# Patient Record
Sex: Male | Born: 1940 | Race: White | Hispanic: No | Marital: Married | Smoking: Former smoker
Health system: Southern US, Community
[De-identification: ages and names within clinical notes are randomized; demographics above are authoritative.]

## PROBLEM LIST (undated history)

## (undated) DIAGNOSIS — I10 Essential (primary) hypertension: Secondary | ICD-10-CM

## (undated) DIAGNOSIS — N189 Chronic kidney disease, unspecified: Secondary | ICD-10-CM

## (undated) DIAGNOSIS — I219 Acute myocardial infarction, unspecified: Secondary | ICD-10-CM

## (undated) DIAGNOSIS — G4730 Sleep apnea, unspecified: Secondary | ICD-10-CM

## (undated) DIAGNOSIS — J449 Chronic obstructive pulmonary disease, unspecified: Secondary | ICD-10-CM

## (undated) DIAGNOSIS — E785 Hyperlipidemia, unspecified: Secondary | ICD-10-CM

## (undated) HISTORY — PX: CORONARY ARTERY BYPASS GRAFT: SHX141

## (undated) HISTORY — DX: Sleep apnea, unspecified: G47.30

## (undated) HISTORY — PX: TONSILLECTOMY: SUR1361

## (undated) HISTORY — DX: Chronic kidney disease, unspecified: N18.9

## (undated) HISTORY — DX: Hyperlipidemia, unspecified: E78.5

## (undated) HISTORY — DX: Chronic obstructive pulmonary disease, unspecified: J44.9

## (undated) HISTORY — PX: FOOT SURGERY: SHX648

## (undated) HISTORY — PX: KIDNEY SURGERY: SHX687

## (undated) HISTORY — PX: OTHER SURGICAL HISTORY: SHX169

## (undated) HISTORY — PX: REPLACEMENT TOTAL KNEE: SUR1224

## (undated) HISTORY — DX: Essential (primary) hypertension: I10

## (undated) HISTORY — DX: Acute myocardial infarction, unspecified: I21.9

---

## 2003-12-16 ENCOUNTER — Ambulatory Visit: Payer: Self-pay

## 2004-02-20 ENCOUNTER — Ambulatory Visit: Payer: Self-pay | Admitting: General Practice

## 2006-10-28 ENCOUNTER — Ambulatory Visit: Payer: Self-pay | Admitting: Internal Medicine

## 2007-08-29 ENCOUNTER — Ambulatory Visit: Payer: Self-pay | Admitting: Internal Medicine

## 2007-08-29 ENCOUNTER — Other Ambulatory Visit: Payer: Self-pay

## 2007-09-23 ENCOUNTER — Encounter: Payer: Self-pay | Admitting: Internal Medicine

## 2007-10-22 ENCOUNTER — Encounter: Payer: Self-pay | Admitting: Internal Medicine

## 2007-11-22 ENCOUNTER — Encounter: Payer: Self-pay | Admitting: Internal Medicine

## 2007-12-22 ENCOUNTER — Encounter: Payer: Self-pay | Admitting: Internal Medicine

## 2010-10-15 ENCOUNTER — Ambulatory Visit: Payer: Self-pay | Admitting: Internal Medicine

## 2015-08-07 ENCOUNTER — Encounter: Payer: Medicare HMO | Attending: Internal Medicine | Admitting: *Deleted

## 2015-08-07 VITALS — Ht 74.5 in | Wt 308.0 lb

## 2015-08-07 DIAGNOSIS — I208 Other forms of angina pectoris: Secondary | ICD-10-CM

## 2015-08-07 NOTE — Progress Notes (Signed)
Cardiac Individual Treatment Plan  Patient Details  Name: Bradley ChamberlainWilliam C Goodwill Jr. MRN: [id number] Date of Birth: 20-Nov-1940 Referring Provider:        Card[facility]   Referring Provider  Bradley Barnes      Initial Encounter Date:       [facility]   Date  08/07/15   Referring Provider  Bradley Barnes      Visit Diagnosis: Stable angina [facility](HCC)  Patient's Home Medications on Admission:  Current outpatient prescriptions:  .  Acetaminophen 500 MG coapsule, Take by mouth., Disp: , Rfl:  .  albuterol (PROAIR HFA) 108 (90 Base) MCG/ACT inhaler, Inhale into the lungs., Disp: , Rfl:  .  Alum Hydroxide-Mag Carbonate (GAVISCON EXTRA RELIEF FORMULA PO), Take by mouth., Disp: , Rfl:  .  amLODipine (NORVASC) 10 MG tablet, Take by mouth., Disp: , Rfl:  .  aspirin EC 81 MG tablet, Take by mouth., Disp: , Rfl:  .  atorvastatin (LIPITOR) 40 MG tablet, Take by mouth., Disp: , Rfl:  .  docusate (COLACE) 50 MG/5ML liquid, Take by mouth., Disp: , Rfl:  .  Investigational - Study Medication, Take by mouth. [facility] IDS, Disp: , Rfl:  .  isosorbide mononitrate (IMDUR) 60 MG 24 hr tablet, Take by mouth., Disp: , Rfl:  .  metoprolol (TOPROL-XL) 200 MG 24 hr tablet, Take by mouth., Disp: , Rfl:  .  Multiple Vitamin (MULTI-VITAMINS) TABS, Take by mouth., Disp: , Rfl:  .  nitroGLYCERIN (NITROSTAT) 0.4 MG SL tablet, Place under the tongue., Disp: , Rfl:  .  Omega-3 1000 MG CAPS, Take by mouth., Disp: , Rfl:  .  ramipril (ALTACE) 10 MG capsule, Take by mouth., Disp: , Rfl:  .  ranitidine (ZANTAC) 300 MG tablet, Take by mouth., Disp: , Rfl:  .  sodium chloride (OCEAN) 0.65 % nasal spray, Place 1 spray into the nose as needed for congestion., Disp: , Rfl:  .  sodium chloride (V-R NASAL SPRAY SALINE) 0.65 % nasal spray, , Disp: , Rfl:  .  tiotropium (SPIRIVA) 18 MCG inhalation capsule, Place into inhaler and inhale.,  Disp: , Rfl:  .  White Petrolatum-Mineral Oil (TEARS NATURALE PM OP), Apply to eye. Dextran 70 eye drops used, Disp: , Rfl:   Past Medical History: No past medical history on file.  Tobacco Use: History  Smoking status  . Not on file  Smokeless tobacco  . Not on file    Labs: Recent Review Flowsheet Data    There is no flowsheet data to display.       Exercise Target Goals: Date: 08/07/15  Exercise Program Goal: Individual exercise prescription set with THRR, safety & activity barriers. Participant demonstrates ability to understand and report RPE using BORG scale, to self-measure pulse accurately, and to acknowledge the importance of the exercise prescription.  Exercise Prescription Goal: Starting with aerobic activity 30 plus minutes a day, 3 days per week for initial exercise prescription. Provide home exercise prescription and guidelines that participant acknowledges understanding prior to discharge.  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 Back pain from scars of procedures   Cardiac Risk Stratification High      6 Minute Walk:     6 Minute Walk      08/07/15 1439       6 Minute Walk   Distance 858 feet     Walk Time 6 minutes     MPH 1.63     METS 1.3     RPE 15     VO2 Peak 4.53     Symptoms Yes (comment)     Comments short of breath and right R knee and hip hurt      Resting HR 56 bpm     Resting BP 124/60 mmHg     Max Ex. HR 107 bpm     Max Ex. BP 142/68 mmHg         Initial Exercise Prescription:     Initial Exercise Prescription - 08/07/15 1400    Date of Initial Exercise RX and Referring Provider   Date 08/07/15   Referring Provider Bradley Barnes   Treadmill   MPH 1   Grade 0   Minutes 5  build to 10   METs 1.77   NuStep   Level 2   Minutes 15   METs 1.6   Arm Ergometer   Level 1   Minutes 15   METs 1.6   Recumbant Elliptical   Level 1   RPM 60   Minutes 15   METs 2   T5 Nustep   Level 2   Minutes 15   METs 2   Biostep-RELP   Level 2   Minutes 15   METs 2   Prescription Details   Frequency (times per week) 3   Duration Progress to 30 minutes of continuous aerobic without signs/symptoms of physical distress   Intensity   THRR 40-80% of Max Heartrate 92-127   Ratings of Perceived Exertion 11-13   Progression   Progression Continue to progress workloads to maintain intensity without signs/symptoms of physical distress.   Resistance Training   Training Prescription Yes   Weight 3   Reps 10-15      Perform Capillary Blood Glucose checks as needed.  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          Discharge Exercise Prescription (Final Exercise Prescription Changes):     Exercise Prescription Changes - 08/07/15 1230    Response to Exercise   Blood Pressure (Admit) 124/60 mmHg   Blood Pressure (Exercise) 142/68 mmHg   Blood Pressure (Exit) 122/70 mmHg   Heart Rate (Admit) 56 bpm   Heart  Rate (Exercise) 107 bpm   Heart Rate (Exit) 74 bpm      Nutrition:  Target Goals: Understanding of nutrition guidelines, daily intake of sodium 1500mg , cholesterol 200mg , calories 30% from fat and 7% or less from saturated fats, daily to have 5 or more servings of fruits and vegetables.  Biometrics:     Pre Biometrics - 08/07/15 1438    Pre Biometrics   Height 6' 2.5" (1.892 m)   Weight (!) 308 lb (139.708 kg)   Waist Circumference 55 inches   Hip Circumference 51.25 inches   Waist to Hip Ratio 1.07 %   BMI (Calculated) 39.1       Nutrition Therapy Plan and Nutrition Goals:     Nutrition Therapy & Goals - 08/07/15 1548    Intervention Plan   Intervention Prescribe, educate and counsel regarding individualized specific dietary modifications aiming towards targeted core components such as weight, hypertension, lipid management, diabetes, heart failure and other comorbidities.   Expected Outcomes Short Term Goal: Understand basic principles of dietary content, such as calories, fat, sodium, cholesterol and nutrients.;Short Term Goal: A plan has been developed with personal nutrition goals set during dietitian appointment.;Long Term Goal: Adherence to prescribed nutrition plan.      Nutrition Discharge: Rate Your Plate Scores:   Nutrition Goals Re-Evaluation:   Psychosocial: Target Goals: Acknowledge presence or absence of depression, maximize coping skills, provide positive support system. Participant is able to verbalize types and ability to use techniques and skills needed for reducing stress and depression.  Initial Review & Psychosocial Screening:     Initial Psych Review & Screening - 08/07/15 1515    Family Dynamics   Good Support System? Yes  wife and church.  has one churchhe continues to go for prayer group and another he attends   Barriers   Psychosocial barriers to participate in program There are no identifiable barriers or psychosocial needs.;The patient  should benefit from training in stress management and relaxation.   Screening Interventions   Interventions Encouraged to exercise      Quality of Life Scores:     Quality of Life - 08/07/15 1513    Quality of Life Scores   Health/Function Pre 17.5 %   Socioeconomic Pre 24.25 %   Psych/Spiritual Pre 23.5 %   Family Pre 20.3 %   GLOBAL Pre 20.42 %      PHQ-9:     Recent Review Flowsheet Data    Depression screen [facility] 2/9 08/07/2015   Decreased Interest 1   Down, Depressed, Hopeless 0   PHQ - 2 Score 1   Altered sleeping 1   Tired, decreased energy 1   Change in appetite 0   Feeling bad or failure about yourself  0   Trouble concentrating 1   Moving slowly or fidgety/restless 0   Suicidal thoughts 0   PHQ-9 Score 4   Difficult doing work/chores Somewhat difficult      Psychosocial Evaluation and Intervention:   Psychosocial Re-Evaluation:   Vocational Rehabilitation: Provide vocational rehab assistance to qualifying candidates.   Vocational Rehab Evaluation & Intervention:     Vocational Rehab - 08/07/15 1511    Initial Vocational Rehab Evaluation & Intervention   Assessment shows need for Vocational Rehabilitation No      Education: Education Goals: Education classes will be provided on a weekly basis, covering required topics. Participant will state understanding/return demonstration of topics presented.  Learning Barriers/Preferences:     Learning Barriers/Preferences - 08/07/15 1555  Learning Barriers/Preferences   Learning Barriers Exercise Concerns  SInce foot surgery and immobility causing deconditioning      Education Topics: General Nutrition Guidelines/Fats and Fiber: -Group instruction provided by verbal, written material, models and posters to present the general guidelines for heart healthy nutrition. Gives an explanation and review of dietary fats and fiber.   Controlling Sodium/Reading Food Labels: -Group verbal and written  material supporting the discussion of sodium use in heart healthy nutrition. Review and explanation with models, verbal and written materials for utilization of the food label.   Exercise Physiology & Risk Factors: - Group verbal and written instruction with models to review the exercise physiology of the cardiovascular system and associated critical values. Details cardiovascular disease risk factors and the goals associated with each risk factor.   Aerobic Exercise & Resistance Training: - Gives group verbal and written discussion on the health impact of inactivity. On the components of aerobic and resistive training programs and the benefits of this training and how to safely progress through these programs.   Flexibility, Balance, General Exercise Guidelines: - Provides group verbal and written instruction on the benefits of flexibility and balance training programs. Provides general exercise guidelines with specific guidelines to those with heart or lung disease. Demonstration and skill practice provided.   Stress Management: - Provides group verbal and written instruction about the health risks of elevated stress, cause of high stress, and healthy ways to reduce stress.   Depression: - Provides group verbal and written instruction on the correlation between heart/lung disease and depressed mood, treatment options, and the stigmas associated with seeking treatment.   Anatomy & Physiology of the Heart: - Group verbal and written instruction and models provide basic cardiac anatomy and physiology, with the coronary electrical and arterial systems. Review of: AMI, Angina, Valve disease, Heart Failure, Cardiac Arrhythmia, Pacemakers, and the ICD.   Cardiac Procedures: - Group verbal and written instruction and models to describe the testing methods done to diagnose heart disease. Reviews the outcomes of the test results. Describes the treatment choices: Medical Management, Angioplasty,  or Coronary Bypass Surgery.   Cardiac Medications: - Group verbal and written instruction to review commonly prescribed medications for heart disease. Reviews the medication, class of the drug, and side effects. Includes the steps to properly store meds and maintain the prescription regimen.   Go Sex-Intimacy & Heart Disease, Get SMART - Goal Setting: - Group verbal and written instruction through game format to discuss heart disease and the return to sexual intimacy. Provides group verbal and written material to discuss and apply goal setting through the application of the S.M.A.R.T. Method.   Other Matters of the Heart: - Provides group verbal, written materials and models to describe Heart Failure, Angina, Valve Disease, and Diabetes in the realm of heart disease. Includes description of the disease process and treatment options available to the cardiac patient.   Exercise & Equipment Safety: - Individual verbal instruction and demonstration of equipment use and safety with use of the equipment.   Infection Prevention: - Provides verbal and written material to individual with discussion of infection control including proper hand washing and proper equipment cleaning during exercise session.   Falls Prevention: - Provides verbal and written material to individual with discussion of falls prevention and safety.   Diabetes: - Individual verbal and written instruction to review signs/symptoms of diabetes, desired ranges of glucose level fasting, after meals and with exercise. Advice that pre and post exercise glucose checks will be done for  3 sessions at entry of program.    Knowledge Questionnaire Score:     Knowledge Questionnaire Score - 08/07/15 1509    Knowledge Questionnaire Score   Pre Score 27/28      Core Components/Risk Factors/Patient Goals at Admission:     Personal Goals and Risk Factors at Admission - 08/07/15 1516    Core Components/Risk Factors/Patient  Goals on Admission    Weight Management Yes;Obesity   Intervention Weight Management: Develop a combined nutrition and exercise program designed to reach desired caloric intake, while maintaining appropriate intake of nutrient and fiber, sodium and fats, and appropriate energy expenditure required for the weight goal.;Weight Management: Provide education and appropriate resources to help participant work on and attain dietary goals.;Weight Management/Obesity: Establish reasonable short term and long term weight goals.;Obesity: Provide education and appropriate resources to help participant work on and attain dietary goals.   Admit Weight 308 lb 4.8 oz (139.844 kg)   Goal Weight: Short Term 300 lb (136.079 kg)   Goal Weight: Long Term 220 lb (99.791 kg)   Expected Outcomes Short Term: Continue to assess and modify interventions until short term weight is achieved;Long Term: Adherence to nutrition and physical activity/exercise program aimed toward attainment of established weight goal   Sedentary Yes  has had foot surgery x 2 since sept 2015, been sedentary since   Intervention Provide advice, education, support and counseling about physical activity/exercise needs.;Develop an individualized exercise prescription for aerobic and resistive training based on initial evaluation findings, risk stratification, comorbidities and participant's personal goals.   Expected Outcomes Achievement of increased cardiorespiratory fitness and enhanced flexibility, muscular endurance and strength shown through measurements of functional capacity and personal statement of participant.   Increase Strength and Stamina Yes   Intervention Provide advice, education, support and counseling about physical activity/exercise needs.;Develop an individualized exercise prescription for aerobic and resistive training based on initial evaluation findings, risk stratification, comorbidities and participant's personal goals.   Expected  Outcomes Achievement of increased cardiorespiratory fitness and enhanced flexibility, muscular endurance and strength shown through measurements of functional capacity and personal statement of participant.   Hypertension Yes   Intervention Provide education on lifestyle modifcations including regular physical activity/exercise, weight management, moderate sodium restriction and increased consumption of fresh fruit, vegetables, and low fat dairy, alcohol moderation, and smoking cessation.;Monitor prescription use compliance.   Expected Outcomes Short Term: Continued assessment and intervention until BP is < 140/32mm HG in hypertensive participants. < 130/64mm HG in hypertensive participants with diabetes, heart failure or chronic kidney disease.;Long Term: Maintenance of blood pressure at goal levels.   Lipids Yes   Intervention Provide education and support for participant on nutrition & aerobic/resistive exercise along with prescribed medications to achieve LDL 70mg , HDL >40mg .   Expected Outcomes Short Term: Participant states understanding of desired cholesterol values and is compliant with medications prescribed. Participant is following exercise prescription and nutrition guidelines.;Long Term: Cholesterol controlled with medications as prescribed, with individualized exercise RX and with personalized nutrition plan. Value goals: LDL < , HDL > 40 mg.      Core Components/Risk Factors/Patient Goals Review:    Core Components/Risk Factors/Patient Goals at Discharge (Final Review):    ITP Comments:     ITP Comments      08/07/15 1503           ITP Comments medical review and intial ITP completed.  Doscumnetation of diagnosis is found CARE EVERYWHERE [facility] 07/28/2015 office visit  Comments:

## 2015-08-07 NOTE — Patient Instructions (Signed)
Patient Instructions  Patient Details  Name: Bradley ChamberlainWilliam C Gaultney Jr. MRN: [id number] Date of Birth: [birth year] Referring Provider:  Irena ReichmannGranger, Chris, MD  Below are the personal goals you chose as well as exercise and nutrition goals. Our goal is to help you keep on track towards obtaining and maintaining your goals. We will be discussing your progress on these goals with you throughout the program.  Initial Exercise Prescription:     Initial Exercise Prescription - 08/07/15 1400    Date of Initial Exercise RX and Referring Provider   Date 08/07/15   Referring Provider [name]   Treadmill   MPH 1   Grade 0   Minutes 5  build to 10   METs 1.77   NuStep   Level 2   Minutes 15   METs 1.6   Arm Ergometer   Level 1   Minutes 15   METs 1.6   Recumbant Elliptical   Level 1   RPM 60   Minutes 15   METs 2   T5 Nustep   Level 2   Minutes 15   METs 2   Biostep-RELP   Level 2   Minutes 15   METs 2   Prescription Details   Frequency (times per week) 3   Duration Progress to 30 minutes of continuous aerobic without signs/symptoms of physical distress   Intensity   THRR 40-80% of Max Heartrate 92-127   Ratings of Perceived Exertion 11-13   Progression   Progression Continue to progress workloads to maintain intensity without signs/symptoms of physical distress.   Resistance Training   Training Prescription Yes   Weight 3   Reps 10-15      Exercise Goals: Frequency: Be able to perform aerobic exercise three times per week working toward 3-5 days per week.  Intensity: Work with a perceived exertion of 11 (fairly light) - 15 (hard) as tolerated. Follow your new exercise prescription and watch for changes in prescription as you progress with the program. Changes will be reviewed with you when they are made.  Duration: You should be able to do 30 minutes of continuous aerobic exercise in addition to a 5 minute warm-up and a 5 minute cool-down routine.  Nutrition Goals: Your  personal nutrition goals will be established when you do your nutrition analysis with the dietician.  The following are nutrition guidelines to follow: Cholesterol < 200mg /day Sodium < 1500mg /day Fiber: Men over 50 yrs - 30 grams per day  Personal Goals:     Personal Goals and Risk Factors at Admission - 08/07/15 1516    Core Components/Risk Factors/Patient Goals on Admission    Weight Management Yes;Obesity   Intervention Weight Management: Develop a combined nutrition and exercise program designed to reach desired caloric intake, while maintaining appropriate intake of nutrient and fiber, sodium and fats, and appropriate energy expenditure required for the weight goal.;Weight Management: Provide education and appropriate resources to help participant work on and attain dietary goals.;Weight Management/Obesity: Establish reasonable short term and long term weight goals.;Obesity: Provide education and appropriate resources to help participant work on and attain dietary goals.   Admit Weight 308 lb 4.8 oz (139.844 kg)   Goal Weight: Short Term 300 lb (136.079 kg)   Goal Weight: Long Term 220 lb (99.791 kg)   Expected Outcomes Short Term: Continue to assess and modify interventions until short term weight is achieved;Long Term: Adherence to nutrition and physical activity/exercise program aimed toward attainment of established weight goal   Sedentary Yes  has had foot surgery x 2 since sept 2015, been sedentary since   Intervention Provide advice, education, support and counseling about physical activity/exercise needs.;Develop an individualized exercise prescription for aerobic and resistive training based on initial evaluation findings, risk stratification, comorbidities and participant's personal goals.   Expected Outcomes Achievement of increased cardiorespiratory fitness and enhanced flexibility, muscular endurance and strength shown through measurements of functional capacity and personal  statement of participant.   Increase Strength and Stamina Yes   Intervention Provide advice, education, support and counseling about physical activity/exercise needs.;Develop an individualized exercise prescription for aerobic and resistive training based on initial evaluation findings, risk stratification, comorbidities and participant's personal goals.   Expected Outcomes Achievement of increased cardiorespiratory fitness and enhanced flexibility, muscular endurance and strength shown through measurements of functional capacity and personal statement of participant.   Hypertension Yes   Intervention Provide education on lifestyle modifcations including regular physical activity/exercise, weight management, moderate sodium restriction and increased consumption of fresh fruit, vegetables, and low fat dairy, alcohol moderation, and smoking cessation.;Monitor prescription use compliance.   Expected Outcomes Short Term: Continued assessment and intervention until BP is < 140/66mm HG in hypertensive participants. < 130/78mm HG in hypertensive participants with diabetes, heart failure or chronic kidney disease.;Long Term: Maintenance of blood pressure at goal levels.   Lipids Yes   Intervention Provide education and support for participant on nutrition & aerobic/resistive exercise along with prescribed medications to achieve LDL 70mg , HDL >40mg .   Expected Outcomes Short Term: Participant states understanding of desired cholesterol values and is compliant with medications prescribed. Participant is following exercise prescription and nutrition guidelines.;Long Term: Cholesterol controlled with medications as prescribed, with individualized exercise RX and with personalized nutrition plan. Value goals: LDL < , HDL > 40 mg.      Tobacco Use Initial Evaluation: History  Smoking status  . Not on file  Smokeless tobacco  . Not on file    Copy of goals given to participant.

## 2015-08-15 ENCOUNTER — Encounter: Payer: Medicare HMO | Admitting: *Deleted

## 2015-08-15 DIAGNOSIS — I208 Other forms of angina pectoris: Secondary | ICD-10-CM | POA: Diagnosis not present

## 2015-08-15 NOTE — Progress Notes (Signed)
Daily Session Note  Patient Details  Name: Bradley Barnes. MRN: [id number] Date of Birth: 1940-11-08 Referring Provider:   Flowsheet Row Cardiac Rehab from 08/07/2015 in [facility] Cardiac and Pulmonary Rehab  Referring Provider  [name]      Encounter Date: 08/15/2015  Check In:     Session Check In - 08/15/15 0847      Check-In   Location [facility]-Cardiac & Pulmonary Rehab   Staff Present Alberteen Sam, MA, ACSM RCEP, Exercise Physiologist;Carroll Enterkin, RN, BSN   Supervising physician immediately available to respond to emergencies See telemetry face sheet for immediately available ER MD   Medication changes reported     No   Fall or balance concerns reported    No   Warm-up and Cool-down Performed on first and last piece of equipment   Resistance Training Performed Yes   VAD Patient? No     Pain Assessment   Currently in Pain? No/denies   Multiple Pain Sites No         Goals Met:  Exercise tolerated well Personal goals reviewed No report of cardiac concerns or symptoms Strength training completed today  Goals Unmet:  Not Applicable  Comments: First full day of exercise!  Patient was oriented to gym and equipment including functions, settings, policies, and procedures.  Patient's individual exercise prescription and treatment plan were reviewed.  All starting workloads were established based on the results of the 6 minute walk test done at initial orientation visit.  The plan for exercise progression was also introduced and progression will be customized based on patient's performance and goals.    Dr. Emily Filbert is Medical Director for [place] and [facility].

## 2015-08-15 NOTE — Progress Notes (Signed)
Daily Session Note  Patient Details  Name: Bradley Barnes. MRN: [id number] Date of Birth: [birth year] Referring Provider:   Flowsheet Row Cardiac Rehab from 08/07/2015 in [facility] Cardiac and Pulmonary Rehab  Referring Provider  [name]      Encounter Date: 08/15/2015  Check In:     Session Check In - 08/15/15 1014      Check-In   Staff Present Heath Lark, RN, BSN, CCRP         Goals Met:  Exercise tolerated well No report of cardiac concerns or symptoms Strength training completed today  Goals Unmet:  Not Applicable  Comments: Doing well with exercise prescription progression.    Dr. Emily Filbert is Medical Director for [place] and [facility].

## 2015-08-17 ENCOUNTER — Encounter: Payer: Medicare HMO | Admitting: *Deleted

## 2015-08-17 DIAGNOSIS — I208 Other forms of angina pectoris: Secondary | ICD-10-CM

## 2015-08-17 NOTE — Progress Notes (Signed)
Daily Session Note  Patient Details  Name: Bradley Barnes. MRN: [id number] Date of Birth: [birth year] Referring Provider:   Flowsheet Row Cardiac Rehab from 08/07/2015 in [facility] Cardiac and Pulmonary Rehab  Referring Provider  [name]      Encounter Date: 08/17/2015  Check In:     Session Check In - 08/17/15 0945      Check-In   Location [facility]-Cardiac & Pulmonary Rehab   Staff Present Alberteen Sam, MA, ACSM RCEP, Exercise Physiologist;Amanda Oletta Darter, BA, ACSM CEP, Exercise Physiologist;Carroll Enterkin, RN, BSN   Supervising physician immediately available to respond to emergencies See telemetry face sheet for immediately available ER MD   Medication changes reported     No   Fall or balance concerns reported    No   Warm-up and Cool-down Performed on first and last piece of equipment   Resistance Training Performed Yes   VAD Patient? No     Pain Assessment   Currently in Pain? No/denies   Multiple Pain Sites No           Exercise Prescription Changes - 08/16/15 1500      Exercise Review   Progression No  First full day of exercise     Response to Exercise   Blood Pressure (Admit) 128/82   Blood Pressure (Exercise) 148/82   Blood Pressure (Exit) 124/78   Heart Rate (Admit) 77 bpm   Heart Rate (Exercise) 108 bpm   Heart Rate (Exit) 74 bpm   Rating of Perceived Exertion (Exercise) 12   Symptoms none   Duration Progress to 45 minutes of aerobic exercise without signs/symptoms of physical distress   Intensity THRR unchanged     Progression   Progression Continue to progress workloads to maintain intensity without signs/symptoms of physical distress.   Average METs 2.19     Resistance Training   Training Prescription Yes   Weight 3   Reps 10-15     Interval Training   Interval Training No     Treadmill   MPH --  unable to do treadmill due to knees and back     Bike   Level 0.8   Minutes 15   METs 2.08     NuStep   Level 2   Minutes 15   METs 2.3      Goals Met:  Independence with exercise equipment Exercise tolerated well No report of cardiac concerns or symptoms Strength training completed today  Goals Unmet:  Not Applicable  Comments: Pt able to follow exercise prescription today without complaint.  Will continue to monitor for progression.    Dr. Emily Filbert is Medical Director for [place] and [facility].

## 2015-08-22 ENCOUNTER — Encounter: Payer: Medicare HMO | Attending: Internal Medicine | Admitting: *Deleted

## 2015-08-22 DIAGNOSIS — I208 Other forms of angina pectoris: Secondary | ICD-10-CM | POA: Diagnosis not present

## 2015-08-22 NOTE — Progress Notes (Signed)
Daily Session Note  Patient Details  Name: Bradley Barnes. MRN: [id number] Date of Birth: [birth year] Referring Provider:   Flowsheet Row Cardiac Rehab from 08/07/2015 in [facility] Cardiac and Pulmonary Rehab  Referring Provider  [name]      Encounter Date: 08/22/2015  Check In:     Session Check In - 08/22/15 0836      Check-In   Location [facility]-Cardiac & Pulmonary Rehab   Staff Present Heath Lark, RN, BSN, CCRP;Rebecca Brayton El, DPT, CEEA   Supervising physician immediately available to respond to emergencies See telemetry face sheet for immediately available ER MD   Medication changes reported     No   Fall or balance concerns reported    No   Warm-up and Cool-down Performed on first and last piece of equipment   Resistance Training Performed Yes   VAD Patient? No     Pain Assessment   Currently in Pain? No/denies         Goals Met:  Independence with exercise equipment Exercise tolerated well No report of cardiac concerns or symptoms Strength training completed today  Goals Unmet:  Not Applicable  Comments:  Pt able to follow exercise prescription today without complaint.  Will continue to monitor for progression.    Dr. Emily Filbert is Medical Director for [place] and [facility].

## 2015-08-24 ENCOUNTER — Encounter: Payer: Medicare HMO | Admitting: *Deleted

## 2015-08-24 DIAGNOSIS — I208 Other forms of angina pectoris: Secondary | ICD-10-CM

## 2015-08-24 NOTE — Progress Notes (Signed)
Daily Session Note  Patient Details  Name: Bradley Barnes. MRN: [id number] Date of Birth: 1940/12/16 Referring Provider:   Flowsheet Row Cardiac Rehab from 08/07/2015 in [facility] Cardiac and Pulmonary Rehab  Referring Provider  [name]      Encounter Date: 08/24/2015  Check In:     Session Check In - 08/24/15 1022      Check-In   Location [facility]-Cardiac & Pulmonary Rehab   Staff Present Nada Maclachlan, BA, ACSM CEP, Exercise Physiologist;Kashena Novitski Luan Pulling, MA, ACSM RCEP, Exercise Physiologist;Diane Joya Gaskins, RN, BSN   Supervising physician immediately available to respond to emergencies See telemetry face sheet for immediately available ER MD   Medication changes reported     No   Fall or balance concerns reported    No   Warm-up and Cool-down Performed on first and last piece of equipment   Resistance Training Performed Yes   VAD Patient? No     Pain Assessment   Currently in Pain? No/denies   Multiple Pain Sites No         Goals Met:  Independence with exercise equipment Exercise tolerated well No report of cardiac concerns or symptoms Strength training completed today  Goals Unmet:  Not Applicable  Comments: Pt able to follow exercise prescription today without complaint.  Will continue to monitor for progression.    Dr. Emily Filbert is Medical Director for [place] and [facility].

## 2015-08-25 ENCOUNTER — Encounter: Payer: Self-pay | Admitting: Internal Medicine

## 2015-08-25 ENCOUNTER — Ambulatory Visit (INDEPENDENT_AMBULATORY_CARE_PROVIDER_SITE_OTHER): Payer: 59 | Admitting: Internal Medicine

## 2015-08-25 VITALS — BP 122/68 | Ht 74.0 in | Wt 309.8 lb

## 2015-08-25 DIAGNOSIS — G4730 Sleep apnea, unspecified: Secondary | ICD-10-CM | POA: Insufficient documentation

## 2015-08-25 DIAGNOSIS — J449 Chronic obstructive pulmonary disease, unspecified: Secondary | ICD-10-CM

## 2015-08-25 DIAGNOSIS — G4733 Obstructive sleep apnea (adult) (pediatric): Secondary | ICD-10-CM

## 2015-08-25 DIAGNOSIS — R5382 Chronic fatigue, unspecified: Secondary | ICD-10-CM | POA: Diagnosis not present

## 2015-08-25 DIAGNOSIS — Z9989 Dependence on other enabling machines and devices: Secondary | ICD-10-CM

## 2015-08-25 DIAGNOSIS — G4719 Other hypersomnia: Secondary | ICD-10-CM | POA: Diagnosis not present

## 2015-08-25 NOTE — Progress Notes (Signed)
[facility] Pulmonary Medicine Consultation      Date: 08/25/2015,   MRN# [id number] Bradley Barnes. [birth year] Code Status:  Code Status History    This patient does not have a recorded code status. Please follow your organizational policy for patients in this situation.     Hosp day:@LENGTHOFSTAYDAYS @ Referring MD: @ATDPROV @     PCP:      AdmissionWeight: (!) 309 lb 12.8 oz (140.5 kg)                 CurrentWeight: (!) 309 lb 12.8 oz (140.5 kg) Bradley Pirolli. is a 75 y.o. old male seen in consultation for chronic SOB at the request of Dr Graciela Husbands     CHIEF COMPLAINT:   I have COPD and sleep apnea   HISTORY OF PRESENT ILLNESS   75 yo white male ie here to establish care for COPD and OSA Patient diagnosed with OSA 10 years ago, but therapy with CPAP for 5 years patient states that he has 18 Cm pressure -patient has increased fatigue lately and increased tiredness during the day -has non-refreshed sleep, gained 20 lbs in past year  Patient dx with COPD 5 years ago PFT in 2015 shows moderate COPD -has chronic SOB and DOE seems to be at baseline -on spiriva for many years doing well on it -uses albuterol as needed Quit tobacco use 1993, ho 1.5 ppd for 30 years -used breo before and ir caused chest pain -last prednisone use 2 years ago  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 Chronic kidney disease   . COPD (chronic obstructive pulmonary disease) (HCC)   . Hyperlipidemia   . Hypertension   . Myocardial infarction (HCC)   . Sleep apnea      SURGICAL HISTORY   Past Surgical History:  Procedure Laterality Date  . CARDIAC STENTS    . CORONARY ARTERY BYPASS GRAFT    . FOOT SURGERY    . KIDNEY SURGERY    . REPLACEMENT TOTAL KNEE    . TONSILLECTOMY       FAMILY HISTORY   Family History  Problem Relation Age of Onset  . Breast cancer Mother   . Heart  attack Father   . Heart attack Brother   . Cancer Maternal Grandmother      SOCIAL HISTORY   Social History  Substance Use Topics  . Smoking status: Former Smoker    Packs/day: 1.00    Years: 33.00    Types: Cigarettes    Quit date: 10/03/1991  . Smokeless tobacco: Never Used  . Alcohol use No     MEDICATIONS    Home Medication:  Current Outpatient Rx  . Order #: [id number] Class: Historical Med  . Order #: [id number] Class: Historical Med  . Order #: [id number] Class: Historical Med  . Order #: [id number] Class: Historical Med  . Order #: [id number] Class: Historical Med  . Order #: [id number] Class: Historical Med  . Order #: [id number] Class: Historical Med  . Order #: [id number] Class: Historical Med  . Order #: [id number] Class: Historical Med  . Order #: [id number] Class: Historical Med  . Order #: [id number] Class: Historical Med  . Order #: [id number] Class: Historical Med  . Order #: [id number] Class: Historical Med  . Order #: [id number] Class: Historical Med  . Order #: [id number] Class: Historical Med  . Order #: [id number] Class: Historical Med  . Order #: [id number] Class: Historical Med  Current Medication:  Current Outpatient Prescriptions:  .  Acetaminophen 500 MG coapsule, Take by mouth., Disp: , Rfl:  .  albuterol (PROAIR HFA) 108 (90 Base) MCG/ACT inhaler, Inhale into the lungs., Disp: , Rfl:  .  Alum Hydroxide-Mag Carbonate (GAVISCON EXTRA RELIEF FORMULA PO), Take by mouth., Disp: , Rfl:  .  amLODipine (NORVASC) 10 MG tablet, Take by mouth., Disp: , Rfl:  .  aspirin EC 81 MG tablet, Take by mouth., Disp: , Rfl:  .  atorvastatin (LIPITOR) 40 MG tablet, Take by mouth., Disp: , Rfl:  .  Investigational - Study Medication, Take by mouth. [facility] COMPASS IDS, Disp: , Rfl:  .  isosorbide mononitrate (IMDUR) 60 MG 24 hr tablet, Take by mouth., Disp: , Rfl:  .  metoprolol (TOPROL-XL) 200 MG 24 hr tablet, Take by mouth., Disp: , Rfl:  .  Multiple Vitamin (MULTI-VITAMINS) TABS, Take by  mouth., Disp: , Rfl:  .  nitroGLYCERIN (NITROSTAT) 0.4 MG SL tablet, Place under the tongue., Disp: , Rfl:  .  Omega-3 1000 MG CAPS, Take by mouth., Disp: , Rfl:  .  ramipril (ALTACE) 10 MG capsule, Take by mouth., Disp: , Rfl:  .  ranitidine (ZANTAC) 300 MG tablet, Take by mouth., Disp: , Rfl:  .  sodium chloride (OCEAN) 0.65 % nasal spray, Place 1 spray into the nose as needed for congestion., Disp: , Rfl:  .  sodium chloride (V-R NASAL SPRAY SALINE) 0.65 % nasal spray, , Disp: , Rfl:  .  tiotropium (SPIRIVA) 18 MCG inhalation capsule, Place into inhaler and inhale., Disp: , Rfl:     ALLERGIES   Penicillins; Promethazine; Erythromycin; Fluticasone furoate-vilanterol; Iron; Oxycodone; Proton pump inhibitors; Rosuvastatin; Tomato; Typhoid vaccines; Azithromycin; Sulfa antibiotics; and Testosterone     REVIEW OF SYSTEMS   Review of Systems  Constitutional: Positive for malaise/fatigue. Negative for chills, diaphoresis, fever and weight loss.  HENT: Negative for congestion and hearing loss.   Eyes: Negative for blurred vision and double vision.  Respiratory: Positive for shortness of breath. Negative for cough, hemoptysis, sputum production and wheezing.   Cardiovascular: Positive for leg swelling. Negative for chest pain, palpitations and orthopnea.  Gastrointestinal: Negative for abdominal pain, heartburn, nausea and vomiting.  Genitourinary: Negative for dysuria.  Musculoskeletal: Positive for joint pain. Negative for back pain, myalgias and neck pain.  Skin: Negative for rash.  Neurological: Negative for dizziness, tingling, tremors, weakness and headaches.  Endo/Heme/Allergies: Does not bruise/bleed easily.  Psychiatric/Behavioral: Negative for depression, substance abuse and suicidal ideas.  All other systems reviewed and are negative.    VS: BP 122/68 (BP Location: Left Arm, Cuff Size: Normal)   Ht  (1.88 m)   Wt (!) 309 lb 12.8 oz (140.5 kg)   SpO2 96%   BMI 39.78  kg/m      PHYSICAL EXAM  Physical Exam  Constitutional: He is oriented to person, place, and time. He appears well-developed and well-nourished. No distress.  HENT:  Head: Normocephalic and atraumatic.  Mouth/Throat: No oropharyngeal exudate.  Eyes: EOM are normal. Pupils are equal, round, and reactive to light. No scleral icterus.  Neck: Normal range of motion. Neck supple.  Cardiovascular: Normal rate, regular rhythm and normal heart sounds.   No murmur heard. Pulmonary/Chest: No stridor. No respiratory distress. He has no wheezes.  Abdominal: Soft. Bowel sounds are normal.  Musculoskeletal: Normal range of motion. He exhibits edema.  Neurological: He is alert and oriented to person, place, and time. No cranial nerve deficit.  Skin: Skin is warm. He is not diaphoretic.  Psychiatric: He has a normal mood and affect.     IMAGING   CXR report reviewed no active disease noted   Previous PFT 2015 Ratio 57% Fev1 1.9L 53%   ASSESSMENT/PLAN   75 yo morbidly obese white male seen today for establish care for OSA on CPAP therapy and for Moderate COPD gold Stage B Overall, his resp status seems to be stable at this time and doing well on Spiriva only  1.patient will need repeat sleep study titration to assess pressures/therapy 2.check ONO  3.check PFT and 4.follow up cardiology in Oct 2017 pending 5.cotninue spiriva as prescribed and albuterol as needed    The Patient requires high complexity decision making for assessment and support, frequent evaluation and titration of therapies, application of advanced monitoring technologies and extensive interpretation of multiple databases.   Patient satisfied with Plan of action and management. All questions answered  Lucie Leather, M.D.  [facility]  Medical Director [facility] Medical Director [facility] Cardio-Pulmonary Department

## 2015-08-25 NOTE — Patient Instructions (Signed)
1.cotninue spiriva as directed 2.albuterol as needed 3.sleep study titration 4.check ONO and and PFT's Chronic Obstructive Pulmonary Disease Chronic obstructive pulmonary disease (COPD) is a common lung condition in which airflow from the lungs is limited. COPD is a general term that can be used to describe many different lung problems that limit airflow, including both chronic bronchitis and emphysema. If you have COPD, your lung function will probably never return to normal, but there are measures you can take to improve lung function and make yourself feel better. CAUSES   Smoking (common).  Exposure to secondhand smoke.  Genetic problems.  Chronic inflammatory lung diseases or recurrent infections. SYMPTOMS  Shortness of breath, especially with physical activity.  Deep, persistent (chronic) cough with a large amount of thick mucus.  Wheezing.  Rapid breaths (tachypnea).  Gray or bluish discoloration (cyanosis) of the skin, especially in your fingers, toes, or lips.  Fatigue.  Weight loss.  Frequent infections or episodes when breathing symptoms become much worse (exacerbations).  Chest tightness. DIAGNOSIS Your health care provider will take a medical history and perform a physical examination to diagnose COPD. Additional tests for COPD may include:  Lung (pulmonary) function tests.  Chest X-ray.  CT scan.  Blood tests. TREATMENT  Treatment for COPD may include:  Inhaler and nebulizer medicines. These help manage the symptoms of COPD and make your breathing more comfortable.  Supplemental oxygen. Supplemental oxygen is only helpful if you have a low oxygen level in your blood.  Exercise and physical activity. These are beneficial for nearly all people with COPD.  Lung surgery or transplant.  Nutrition therapy to gain weight, if you are underweight.  Pulmonary rehabilitation. This may involve working with a team of health care providers and  specialists, such as respiratory, occupational, and physical therapists. HOME CARE INSTRUCTIONS  Take all medicines (inhaled or pills) as directed by your health care provider.  Avoid over-the-counter medicines or cough syrups that dry up your airway (such as antihistamines) and slow down the elimination of secretions unless instructed otherwise by your health care provider.  If you are a smoker, the most important thing that you can do is stop smoking. Continuing to smoke will cause further lung damage and breathing trouble. Ask your health care provider for help with quitting smoking. He or she can direct you to community resources or hospitals that provide support.  Avoid exposure to irritants such as smoke, chemicals, and fumes that aggravate your breathing.  Use oxygen therapy and pulmonary rehabilitation if directed by your health care provider. If you require home oxygen therapy, ask your health care provider whether you should purchase a pulse oximeter to measure your oxygen level at home.  Avoid contact with individuals who have a contagious illness.  Avoid extreme temperature and humidity changes.  Eat healthy foods. Eating smaller, more frequent meals and resting before meals may help you maintain your strength.  Stay active, but balance activity with periods of rest. Exercise and physical activity will help you maintain your ability to do things you want to do.  Preventing infection and hospitalization is very important when you have COPD. Make sure to receive all the vaccines your health care provider recommends, especially the pneumococcal and influenza vaccines. Ask your health care provider whether you need a pneumonia vaccine.  Learn and use relaxation techniques to manage stress.  Learn and use controlled breathing techniques as directed by your health care provider. Controlled breathing techniques include:  Pursed lip breathing. Start by breathing  in (inhaling) through  your nose for 1 second. Then, purse your lips as if you were going to whistle and breathe out (exhale) through the pursed lips for 2 seconds.  Diaphragmatic breathing. Start by putting one hand on your abdomen just above your waist. Inhale slowly through your nose. The hand on your abdomen should move out. Then purse your lips and exhale slowly. You should be able to feel the hand on your abdomen moving in as you exhale.  Learn and use controlled coughing to clear mucus from your lungs. Controlled coughing is a series of short, progressive coughs. The steps of controlled coughing are: 1. Lean your head slightly forward. 2. Breathe in deeply using diaphragmatic breathing. 3. Try to hold your breath for 3 seconds. 4. Keep your mouth slightly open while coughing twice. 5. Spit any mucus out into a tissue. 6. Rest and repeat the steps once or twice as needed. SEEK MEDICAL CARE IF:  You are coughing up more mucus than usual.  There is a change in the color or thickness of your mucus.  Your breathing is more labored than usual.  Your breathing is faster than usual. SEEK IMMEDIATE MEDICAL CARE IF:  You have shortness of breath while you are resting.  You have shortness of breath that prevents you from:  Being able to talk.  Performing your usual physical activities.  You have chest pain lasting longer than 5 minutes.  Your skin color is more cyanotic than usual.  You measure low oxygen saturations for longer than 5 minutes with a pulse oximeter. MAKE SURE YOU:  Understand these instructions.  Will watch your condition.  Will get help right away if you are not doing well or get worse.   This information is not intended to replace advice given to you by your health care provider. Make sure you discuss any questions you have with your health care provider.   Document Released: 10/17/2004 Document Revised: 01/28/2014 Document Reviewed: 09/03/2012 Elsevier Interactive Patient  Education [employer]

## 2015-08-29 ENCOUNTER — Encounter: Payer: Medicare HMO | Admitting: *Deleted

## 2015-08-29 DIAGNOSIS — I208 Other forms of angina pectoris: Secondary | ICD-10-CM | POA: Diagnosis not present

## 2015-08-29 NOTE — Progress Notes (Signed)
Daily Session Note  Patient Details  Name: Bradley Barnes. MRN: [id number] Date of Birth: Nov 17, 1940 Referring Provider:   Flowsheet Row Cardiac Rehab from 08/07/2015 in [facility] Cardiac and Pulmonary Rehab  Referring Provider  [name]      Encounter Date: 08/29/2015  Check In:     Session Check In - 08/29/15 0800      Check-In   Location [facility]-Cardiac & Pulmonary Rehab   Staff Present Heath Lark, RN, BSN, CCRP;Diane Joya Gaskins, RN, Levie Heritage, MA, ACSM RCEP, Exercise Physiologist   Supervising physician immediately available to respond to emergencies See telemetry face sheet for immediately available ER MD   Medication changes reported     No   Fall or balance concerns reported    No   Warm-up and Cool-down Performed on first and last piece of equipment   Resistance Training Performed Yes   VAD Patient? No     Pain Assessment   Currently in Pain? No/denies         Goals Met:  Exercise tolerated well No report of cardiac concerns or symptoms Strength training completed today  Goals Unmet:  Not Applicable  Comments: Doing well with exercise prescription progression.    Dr. Emily Filbert is Medical Director for [place] and [facility].

## 2015-08-31 ENCOUNTER — Telehealth: Payer: Self-pay | Admitting: Internal Medicine

## 2015-08-31 ENCOUNTER — Encounter: Payer: Medicare HMO | Admitting: *Deleted

## 2015-08-31 DIAGNOSIS — I208 Other forms of angina pectoris: Secondary | ICD-10-CM

## 2015-08-31 NOTE — Telephone Encounter (Signed)
[facility] and [facility] and he stated that he send order to [facility] to expiate order so patient will get a phone call today or tomorrow to arrange ONO.  Called and spoke with patient and advised of this and asked patient that if [facility]'t [facility] Monday to call me back. Pt stated that he appreciated the call and the update.  Nothing else needed at this time. Rhonda J Cobb

## 2015-08-31 NOTE — Progress Notes (Addendum)
Daily Session Note  Patient Details  Name: Bradley Barnes. MRN: [id number] Date of Birth: 1940/08/12 Referring Provider:   Flowsheet Row Cardiac Rehab from 08/07/2015 in [facility] Cardiac [facility]monary Rehab  Referring Provider  [name]      Encounter Date: 08/31/2015  Check In:     Session Check In - 08/31/15 0839      Check-In   Location [facility]-Cardiac & Pulmonary Rehab   Staff Present Alberteen Sam, MA, ACSM RCEP, Exercise Physiologist;Amanda Oletta Darter, BA, ACSM CEP, Exercise Physiologist;Carroll Enterkin, RN, BSN   Supervising physician immediately available to respond to emergencies See telemetry face sheet for immediately available ER MD   Medication changes reported     No   Fall or balance concerns reported    No   Warm-up and Cool-down Performed on first and last piece of equipment   Resistance Training Performed Yes   VAD Patient? No     Pain Assessment   Currently in Pain? No/denies   Multiple Pain Sites No         Goals Met:  Independence with exercise equipment Exercise tolerated well No report of cardiac concerns or symptoms Strength training completed today  Goals Unmet:  Not Applicable  Comments: Pt able to follow exercise prescription today without complaint.  Will continue to monitor for progression.      Dr. Emily Filbert is Medical Director for [place] and [facility].

## 2015-08-31 NOTE — Telephone Encounter (Signed)
[facility] calling stating [facility] has not received the order for his pulse oximeter  Please call

## 2015-08-31 NOTE — Telephone Encounter (Signed)
Spoke with pt and he states he called [facility] because he hadn't heard from them in regards to his ONO that is to be done. States they told him they didn't have the order. Informed pt we would follow up and give him a call back. Will forward to [place] to call on.

## 2015-09-05 ENCOUNTER — Encounter: Payer: Self-pay | Admitting: Internal Medicine

## 2015-09-05 ENCOUNTER — Encounter: Payer: Medicare HMO | Admitting: *Deleted

## 2015-09-05 DIAGNOSIS — I208 Other forms of angina pectoris: Secondary | ICD-10-CM | POA: Diagnosis not present

## 2015-09-05 DIAGNOSIS — J449 Chronic obstructive pulmonary disease, unspecified: Secondary | ICD-10-CM

## 2015-09-05 NOTE — Progress Notes (Signed)
Daily Session Note  Patient Details  Name: Bradley Barnes. MRN: [id number] Date of Birth: [birth year] Referring Provider:   Flowsheet Row Cardiac Rehab from 08/07/2015 in [facility] Card[facility]  Referring Provider  [name]      Encounter Date: 09/05/2015  Check In:     Session Check In - 09/05/15 1250      Check-In   Location [facility]-Cardiac & Pulmonary Rehab   Staff Present Alberteen Sam, MA, ACSM RCEP, Exercise Physiologist;Carroll Enterkin, RN, BSN;Diane Joya Gaskins, RN, BSN   Supervising physician immediately available to respond to emergencies See telemetry face sheet for immediately available ER MD   Medication changes reported     No   Fall or balance concerns reported    No   Warm-up and Cool-down Performed on first and last piece of equipment   Resistance Training Performed Yes   VAD Patient? No     Pain Assessment   Currently in Pain? No/denies         Goals Met:  Exercise tolerated well No report of cardiac concerns or symptoms Strength training completed today  Goals Unmet:  Not Applicable  Comments:  Pt able to follow exercise prescription today without complaint.  Will continue to monitor for progression.    Dr. Emily Filbert is Medical Director for [place] and [facility].

## 2015-09-06 ENCOUNTER — Encounter: Payer: Self-pay | Admitting: *Deleted

## 2015-09-06 DIAGNOSIS — I208 Other forms of angina pectoris: Secondary | ICD-10-CM

## 2015-09-06 NOTE — Progress Notes (Signed)
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 Continue with ITP unless changes noted by Medical Director at signature of review.         Comments:

## 2015-09-07 DIAGNOSIS — I2089 Other forms of angina pectoris: Secondary | ICD-10-CM

## 2015-09-07 DIAGNOSIS — I208 Other forms of angina pectoris: Secondary | ICD-10-CM

## 2015-09-07 NOTE — Progress Notes (Signed)
Daily Session Note  Patient Details  Name: Bradley Barnes. MRN: [id number] Date of Birth: Feb 07, 1940 Referring Provider:   Flowsheet Row Cardiac Rehab from 08/07/2015 in [facility] Cardiac and Pulmonary Rehab  Referring Provider  [name]      Encounter Date: 09/07/2015  Check In:     Session Check In - 09/07/15 0131      Check-In   Location [facility]-Cardiac & Pulmonary Rehab   Staff Present Alberteen Sam, MA, ACSM RCEP, Exercise Physiologist;Amanda Oletta Darter, BA, ACSM CEP, Exercise Physiologist;Carroll Enterkin, RN, BSN   Supervising physician immediately available to respond to emergencies See telemetry face sheet for immediately available ER MD   Medication changes reported     No   Fall or balance concerns reported    No   Warm-up and Cool-down Performed on first and last piece of equipment   Resistance Training Performed Yes   VAD Patient? No     Pain Assessment   Currently in Pain? No/denies   Multiple Pain Sites No         Goals Met:  Independence with exercise equipment Exercise tolerated well No report of cardiac concerns or symptoms Strength training completed today  Goals Unmet:  Not Applicable  Comments: Pt able to follow exercise prescription today without complaint.  Will continue to monitor for progression.    Dr. Emily Filbert is Medical Director for [place] and [facility].

## 2015-09-12 ENCOUNTER — Telehealth: Payer: Self-pay | Admitting: Internal Medicine

## 2015-09-12 ENCOUNTER — Encounter: Payer: Medicare HMO | Admitting: *Deleted

## 2015-09-12 DIAGNOSIS — I208 Other forms of angina pectoris: Secondary | ICD-10-CM | POA: Diagnosis not present

## 2015-09-12 DIAGNOSIS — J449 Chronic obstructive pulmonary disease, unspecified: Secondary | ICD-10-CM

## 2015-09-12 NOTE — Progress Notes (Signed)
Daily Session Note  Patient Details  Name: Bradley Barnes. MRN: [id number] Date of Birth: 01/20/1941 Referring Provider:   Flowsheet Row Cardiac Rehab from 08/07/2015 in [facility] Cardiac and Pulmonary Rehab  Referring Provider  [name]      Encounter Date: 09/12/2015  Check In:     Session Check In - 09/12/15 0843      Check-In   Location [facility]-Cardiac & Pulmonary Rehab   Staff Present Nyoka Cowden, RN, BSN, Willette Pa, MA, ACSM RCEP, Exercise Physiologist;Susanne Bice, RN, BSN, [facility]   Supervising physician immediately available to respond to emergencies See telemetry face sheet for immediately available ER MD   Medication changes reported     No   Fall or balance concerns reported    No   Warm-up and Cool-down Performed on first and last piece of equipment   Resistance Training Performed Yes   VAD Patient? No     Pain Assessment   Currently in Pain? No/denies   Multiple Pain Sites No         Goals Met:  Independence with exercise equipment Exercise tolerated well No report of cardiac concerns or symptoms Strength training completed today  Goals Unmet:  Not Applicable  Comments: Pt able to follow exercise prescription today without complaint.  Will continue to monitor for progression.    Dr. Emily Filbert is Medical Director for [place] and [facility].

## 2015-09-12 NOTE — Telephone Encounter (Signed)
Patient says kasa ordered a sleep study and insurance required more information to approve testing.  Patient says he has not heard anything back about approval or r/s vs cancelling all together please call to discuss.

## 2015-09-12 NOTE — Telephone Encounter (Signed)
Spoke with pt who states he had a sleep study scheduled with [facility] but it was canceled due to not having enough documentation per insurance. Pt is calling for an update, I informed pt of [facility]'s hours and that they are now closed. I advised pt that someone from our office would call [facility] tomorrow for an update and we would contact after speaking with [facility].   Will route to [place].

## 2015-09-13 NOTE — Telephone Encounter (Signed)
LMOVM for pt to call back in regards to the ONO. He wants pt to repeat ONO due to he only wore it for a little over an hour. Will place new order.

## 2015-09-13 NOTE — Telephone Encounter (Signed)
Spoke to pt and [email or web address]  forms that need to be filled out were sent to rhonda and could be on her desk she has been out of the office a few days will get with her on Thursday am and try to get this moving again pt is aware Bradley Barnes

## 2015-09-13 NOTE — Telephone Encounter (Signed)
Pt returning our call °Please call back ° °

## 2015-09-13 NOTE — Telephone Encounter (Signed)
Spoke with pt and informed him that we need to repeat the ONO. Informed pt of what we received. He agrees.

## 2015-09-14 ENCOUNTER — Encounter: Payer: Medicare HMO | Admitting: *Deleted

## 2015-09-14 DIAGNOSIS — I208 Other forms of angina pectoris: Secondary | ICD-10-CM | POA: Diagnosis not present

## 2015-09-14 NOTE — Telephone Encounter (Signed)
Pt came into clinic today and form was on my desk. Form given to Dr. Belia HemanKasa to complete. Called patient to ask Epworth Sleepiness Scale and obtain this information.  Form has been faxed back to [facility] per their request. Pt has been advised this has been completed. Nothing else at this time to do. Waiting on [employer] approval. Bradley Barnes

## 2015-09-14 NOTE — Progress Notes (Signed)
Daily Session Note  Patient Details  Name: Bradley C Colledge Jr. MRN: [id number] Date of Birth: 03/10/1940 Referring Provider:   Flowsheet Row Cardiac Rehab from 08/07/2015 in [facility] Cardiac and Pulmonary Rehab  Referring Provider  [name]      Encounter Date: 09/14/2015  Check In:     Session Check In - 09/14/15 1034      Check-In   Location [facility]-Cardiac & Pulmonary Rehab   Staff Present Amanda Sommer, BA, ACSM CEP, Exercise Physiologist;Susanne Bice, RN, BSN, CCRP; , MA, ACSM RCEP, Exercise Physiologist   Supervising physician immediately available to respond to emergencies See telemetry face sheet for immediately available ER MD   Medication changes reported     No   Fall or balance concerns reported    No   Warm-up and Cool-down Performed on first and last piece of equipment   Resistance Training Performed Yes   VAD Patient? No     Pain Assessment   Currently in Pain? No/denies   Multiple Pain Sites No           Exercise Prescription Changes - 09/13/15 1400      Exercise Review   Progression Yes     Response to Exercise   Blood Pressure (Admit) 124/64   Blood Pressure (Exercise) 142/80   Blood Pressure (Exit) 110/66   Heart Rate (Admit) 78 bpm   Heart Rate (Exercise) 102 bpm   Heart Rate (Exit) 76 bpm   Rating of Perceived Exertion (Exercise) 14   Symptoms none   Duration Progress to 45 minutes of aerobic exercise without signs/symptoms of physical distress   Intensity THRR unchanged     Progression   Progression Continue to progress workloads to maintain intensity without signs/symptoms of physical distress.   Average METs 2.22     Resistance Training   Training Prescription Yes   Weight 4 lbs   Reps 10-15     Interval Training   Interval Training No     Bike   Level 0.8   Minutes 15   METs 1.97     NuStep   Level 5   Minutes 15   METs 2.5     T5 Nustep   Level 3   Minutes 15   METs 2.2      Goals Met:  Independence  with exercise equipment Exercise tolerated well No report of cardiac concerns or symptoms Strength training completed today  Goals Unmet:  Not Applicable  Comments: Pt able to follow exercise prescription today without complaint.  Will continue to monitor for progression. Reviewed home exercise with pt today.  Pt plans to walk at [facility] for exercise.  Reviewed THR, pulse, RPE, sign and symptoms, NTG use, and when to call 911 or MD.  Also discussed weather considerations and indoor options.  Pt voiced understanding.    Dr. Mark Miller is Medical Director for [facility] and [facility]. 

## 2015-09-19 ENCOUNTER — Encounter: Payer: Medicare HMO | Admitting: *Deleted

## 2015-09-19 DIAGNOSIS — I208 Other forms of angina pectoris: Secondary | ICD-10-CM

## 2015-09-19 NOTE — Progress Notes (Signed)
Daily Session Note  Patient Details  Name: Bradley Barnes. MRN: [id number] Date of Birth: Oct 05, 1940 Referring Provider:   Flowsheet Row Cardiac Rehab from 08/07/2015 in [facility] Cardiac and Pulmonary Rehab  Referring Provider  [name]      Encounter Date: 09/19/2015  Check In:     Session Check In - 09/19/15 0834      Check-In   Location [facility]-Cardiac & Pulmonary Rehab   Staff Present Alberteen Sam, MA, ACSM RCEP, Exercise Physiologist;Susanne Bice, RN, BSN, CCRP;Carroll Enterkin, RN, BSN   Supervising physician immediately available to respond to emergencies See telemetry face sheet for immediately available ER MD   Medication changes reported     No   Fall or balance concerns reported    No   Warm-up and Cool-down Performed on first and last piece of equipment   Resistance Training Performed Yes   VAD Patient? No     Pain Assessment   Currently in Pain? No/denies   Multiple Pain Sites No         Goals Met:  Independence with exercise equipment Exercise tolerated well No report of cardiac concerns or symptoms Strength training completed today  Goals Unmet:  Not Applicable  Comments: Pt able to follow exercise prescription today without complaint.  Will continue to monitor for progression.    Dr. Emily Filbert is Medical Director for [place] and [facility].

## 2015-09-20 ENCOUNTER — Ambulatory Visit (INDEPENDENT_AMBULATORY_CARE_PROVIDER_SITE_OTHER): Payer: 59 | Admitting: *Deleted

## 2015-09-20 ENCOUNTER — Ambulatory Visit: Payer: 59

## 2015-09-20 DIAGNOSIS — J449 Chronic obstructive pulmonary disease, unspecified: Secondary | ICD-10-CM | POA: Diagnosis not present

## 2015-09-20 LAB — PULMONARY FUNCTION TEST
DL/VA % pred: 70 %
DL/VA: 3.38 ml/min/mmHg/L
DLCO UNC: 18.94 ml/min/mmHg
DLCO unc % pred: 50 %
FEF 25-75 POST: 1.18 L/s
FEF 25-75 Pre: 1.01 L/sec
FEF2575-%Change-Post: 16 %
FEF2575-%PRED-POST: 45 %
FEF2575-%Pred-Pre: 39 %
FEV1-%CHANGE-POST: 3 %
FEV1-%PRED-POST: 57 %
FEV1-%PRED-PRE: 55 %
FEV1-POST: 2.04 L
FEV1-Pre: 1.97 L
FEV1FVC-%Change-Post: 7 %
FEV1FVC-%Pred-Pre: 88 %
FEV6-%Change-Post: -1 %
FEV6-%PRED-POST: 64 %
FEV6-%PRED-PRE: 65 %
FEV6-POST: 2.97 L
FEV6-PRE: 3.03 L
FEV6FVC-%Change-Post: 1 %
FEV6FVC-%PRED-POST: 106 %
FEV6FVC-%PRED-PRE: 104 %
FVC-%Change-Post: -3 %
FVC-%Pred-Post: 60 %
FVC-%Pred-Pre: 62 %
FVC-POST: 2.98 L
FVC-Pre: 3.09 L
POST FEV1/FVC RATIO: 68 %
POST FEV6/FVC RATIO: 100 %
PRE FEV6/FVC RATIO: 98 %
Pre FEV1/FVC ratio: 64 %

## 2015-09-20 NOTE — Progress Notes (Signed)
PFT performed today with nitrogen washout. 

## 2015-09-21 DIAGNOSIS — I208 Other forms of angina pectoris: Secondary | ICD-10-CM

## 2015-09-21 DIAGNOSIS — I2089 Other forms of angina pectoris: Secondary | ICD-10-CM

## 2015-09-21 NOTE — Progress Notes (Signed)
Daily Session Note  Patient Details  Name: Bradley Barnes. MRN: [id number] Date of Birth: 1940-11-06 Referring Provider:   Flowsheet Row Cardiac Rehab from 08/07/2015 in [facility] Cardiac and Pulmonary Rehab  Referring Provider  [name]      Encounter Date: 09/21/2015  Check In:     Session Check In - 09/21/15 0831      Check-In   Location [facility]-Cardiac & Pulmonary Rehab   Staff Present Alberteen Sam, MA, ACSM RCEP, Exercise Physiologist;Pressley Barsky Oletta Darter, BA, ACSM CEP, Exercise Physiologist;Carroll Enterkin, RN, BSN   Supervising physician immediately available to respond to emergencies See telemetry face sheet for immediately available ER MD   Medication changes reported     No   Fall or balance concerns reported    No   Warm-up and Cool-down Performed on first and last piece of equipment   Resistance Training Performed Yes   VAD Patient? No     Pain Assessment   Currently in Pain? No/denies   Multiple Pain Sites No         Goals Met:  Independence with exercise equipment Exercise tolerated well No report of cardiac concerns or symptoms Strength training completed today  Goals Unmet:  Not Applicable  Comments: Pt able to follow exercise prescription today without complaint.  Will continue to monitor for progression.    Dr. Emily Filbert is Medical Director for [place] and [facility].

## 2015-09-22 ENCOUNTER — Encounter: Payer: Self-pay | Admitting: Internal Medicine

## 2015-09-26 ENCOUNTER — Encounter: Payer: Medicare HMO | Attending: Internal Medicine | Admitting: *Deleted

## 2015-09-26 DIAGNOSIS — I208 Other forms of angina pectoris: Secondary | ICD-10-CM | POA: Insufficient documentation

## 2015-09-26 DIAGNOSIS — I2089 Other forms of angina pectoris: Secondary | ICD-10-CM

## 2015-09-26 NOTE — Progress Notes (Signed)
Daily Session Note  Patient Details  Name: Bradley Barnes. MRN: [id number] Date of Birth: Jan 31, 1940 Referring Provider:   Flowsheet Row Cardiac Rehab from 08/07/2015 in [facility] Cardiac and Pulmonary Rehab  Referring Provider  [name]      Encounter Date: 09/26/2015  Check In:     Session Check In - 09/26/15 1008      Check-In   Location [facility]-Cardiac & Pulmonary Rehab   Staff Present Gerlene Burdock, RN, Levie Heritage, MA, ACSM RCEP, Exercise Physiologist;Laureen Owens Shark, BS, RRT, Respiratory Therapist   Supervising physician immediately available to respond to emergencies See telemetry face sheet for immediately available ER MD   Medication changes reported     No   Fall or balance concerns reported    No   Warm-up and Cool-down Performed on first and last piece of equipment   Resistance Training Performed Yes   VAD Patient? No     Pain Assessment   Currently in Pain? No/denies   Multiple Pain Sites No         Goals Met:  Independence with exercise equipment Exercise tolerated well No report of cardiac concerns or symptoms Strength training completed today  Goals Unmet:  Not Applicable  Comments: Pt able to follow exercise prescription today without complaint.  Will continue to monitor for progression.    Dr. Emily Filbert is Medical Director for [place] and [facility].

## 2015-09-27 ENCOUNTER — Ambulatory Visit: Payer: 59 | Admitting: Internal Medicine

## 2015-10-03 ENCOUNTER — Encounter: Payer: Medicare HMO | Admitting: *Deleted

## 2015-10-03 DIAGNOSIS — I208 Other forms of angina pectoris: Secondary | ICD-10-CM | POA: Diagnosis not present

## 2015-10-03 NOTE — Progress Notes (Signed)
Daily Session Note  Patient Details  Name: Bradley Barnes. MRN: [id number] Date of Birth: 12-Nov-1940 Referring Provider:   Flowsheet Row Cardiac Rehab from 08/07/2015 in [facility] Cardiac and Pulmonary Rehab  Referring Provider  [name]      Encounter Date: 10/03/2015  Check In:     Session Check In - 10/03/15 1036      Check-In   Location [facility]-Cardiac & Pulmonary Rehab   Staff Present Heath Lark, RN, BSN, CCRP;Chrislynn Mosely Advance, MA, ACSM RCEP, Exercise Physiologist;Laureen Owens Shark, BS, RRT, Respiratory Therapist   Supervising physician immediately available to respond to emergencies See telemetry face sheet for immediately available ER MD   Medication changes reported     No   Fall or balance concerns reported    No   Warm-up and Cool-down Performed on first and last piece of equipment   Resistance Training Performed Yes   VAD Patient? No     Pain Assessment   Currently in Pain? No/denies   Multiple Pain Sites No         Goals Met:  Independence with exercise equipment Exercise tolerated well No report of cardiac concerns or symptoms Strength training completed today  Goals Unmet:  Not Applicable  Comments: Pt able to follow exercise prescription today without complaint.  Will continue to monitor for progression.    Dr. Emily Filbert is Medical Director for [place] and [facility].

## 2015-10-04 ENCOUNTER — Encounter: Payer: Self-pay | Admitting: *Deleted

## 2015-10-04 ENCOUNTER — Telehealth: Payer: Self-pay | Admitting: *Deleted

## 2015-10-04 DIAGNOSIS — I208 Other forms of angina pectoris: Secondary | ICD-10-CM

## 2015-10-04 NOTE — Telephone Encounter (Signed)
LM with wife for pt to call back for ONO results. Will await call.

## 2015-10-04 NOTE — Progress Notes (Signed)
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 until short term weight is achieved;Long Term: Adherence to nutrition and physical activity/exercise program aimed toward attainment of established weight goal   Sedentary Yes  has had foot surgery x 2 since sept 2015, been sedentary since   Intervention Provide advice, education, support and counseling about physical activity/exercise needs.;Develop an individualized exercise prescription for aerobic and resistive training based on initial evaluation findings, risk stratification, comorbidities and participant's personal goals.   Expected Outcomes Achievement of increased cardiorespiratory fitness and enhanced flexibility, muscular endurance and strength shown through  measurements of functional capacity and personal  statement of participant.   Increase Strength and Stamina Yes   Intervention Provide advice, education, support and counseling about physical activity/exercise needs.;Develop an individualized exercise prescription for aerobic and resistive training based on initial evaluation findings, risk stratification, comorbidities and participant's personal goals.   Expected Outcomes Achievement of increased cardiorespiratory fitness and enhanced flexibility, muscular endurance and strength shown through measurements of functional capacity and personal statement of participant.   Hypertension Yes   Intervention Provide education on lifestyle modifcations including regular physical activity/exercise, weight management, moderate sodium restriction and increased consumption of fresh fruit, vegetables, and low fat dairy, alcohol moderation, and smoking cessation.;Monitor prescription use compliance.   Expected Outcomes Short Term: Continued assessment and intervention until BP is < 140/25m HG in hypertensive participants. < 130/86mHG in hypertensive participants with diabetes, heart failure or chronic kidney disease.;Long Term: Maintenance of blood pressure at goal levels.   Lipids Yes   Intervention Provide education and support for participant on nutrition & aerobic/resistive exercise along with prescribed medications to achieve LDL <7091mHDL >76m49m Expected Outcomes Short Term: Participant states understanding of desired cholesterol values and is compliant with medications prescribed. Participant is following exercise prescription and nutrition guidelines.;Long Term: Cholesterol controlled with medications as prescribed, with individualized exercise RX and with personalized nutrition plan. Value goals: LDL < 70mg49mL > 40 mg.      Core Components/Risk Factors/Patient Goals Review:      Goals and Risk Factor Review    Row Name 08/31/15 0952 09/21/15 1043           Core Components/Risk  Factors/Patient Goals Review   Personal Goals Review Weight Management/Obesity;Sedentary;Increase Strength and Stamina;Lipids;Hypertension Weight Management/Obesity;Sedentary;Increase Strength and Stamina;Hypertension;Lipids;Stress      Review Bill's weight was up today 2 lbs.  He did admit to cheating on his diet with pizza yesterday.  In general, it had been trending down.  He is not checking his blood pressure at home, but it also was up some today (140s).  He is taking his statin with no problems.  He has been doing better with his diet overall and watching his portion sizes.  His stress was a up some today as he has to go to court on Monday. Bill's weight was up again some today.  He has been cutting back on portions for dinner. He did have some cookies that a neighbor had cooked for him.  His blood pressure has been good here, but his is not check it or his blood sugars at home.  His stress levels are back to normal.  He has not had any problems with taking his statin.  He is exercising at home by walking two extra days a week.      Expected Outcomes Bill Rush [place] continue to come to class for exercise and education.  We will continue to monitor his weight and blood pressure. Bill Rush [place] continue to come to class for exercise and education.  We will continue to monitor his weight and blood pressure.         Core Components/Risk Factors/Patient Goals at Discharge (Final Review):      Goals and Risk Factor Review - 09/21/15 1043      Core Components/Risk Factors/Patient Goals Review   Personal Goals Review Weight Management/Obesity;Sedentary;Increase Strength and Stamina;Hypertension;Lipids;Stress   Review Bill's weight was up again some today.  He has been cutting back on portions for dinner. He did have some cookies that a neighbor  had cooked for him.  His blood pressure has been good here, but his is not check it or his blood sugars at home.  His stress levels are back to normal.  He has not had any  problems with taking his statin.  He is exercising at home by walking two extra days a week.   Expected Outcomes Rush [place]mark will continue to come to class for exercise and education.  We will continue to monitor his weight and blood pressure.      ITP Comments:     ITP Comments    Row Name 08/07/15 1503 09/06/15 0728 09/14/15 1034 10/04/15 0640     ITP Comments medical review and intial ITP completed.  Doscumnetation of diagnosis is found CARE [facility]WHERE [facility] 07/28/2015 office visit 30 day review. Continue with ITP unless changes noted by Medical Director at signature of review. Pt mentioned today that he took two nitroglycerin pills at home two days ago.  He was encouraged to contact his cardiologist to let them know as well. 30 day review. Continue with ITP unless changes noted by Medical Director at signature of review.       Comments:

## 2015-10-04 NOTE — Telephone Encounter (Signed)
Pt informed his ONO results were negative. Nothing further needed.

## 2015-10-05 ENCOUNTER — Encounter: Payer: Medicare HMO | Admitting: *Deleted

## 2015-10-05 DIAGNOSIS — I208 Other forms of angina pectoris: Secondary | ICD-10-CM | POA: Diagnosis not present

## 2015-10-05 NOTE — Progress Notes (Signed)
Daily Session Note  Patient Details  Name: Bradley Barnes. MRN: [id number] Date of Birth: 06-07-1940 Referring Provider:   Flowsheet Row Cardiac Rehab from 08/07/2015 in [facility] Cardiac and Pulmonary Rehab  Referring Provider  [name]      Encounter Date: 10/05/2015  Check In:     Session Check In - 10/05/15 0930      Check-In   Location [facility]-Cardiac & Pulmonary Rehab   Staff Present Alberteen Sam, MA, ACSM RCEP, Exercise Physiologist;Amanda Oletta Darter, BA, ACSM CEP, Exercise Physiologist;Carroll Enterkin, RN, BSN   Supervising physician immediately available to respond to emergencies See telemetry face sheet for immediately available ER MD   Medication changes reported     No   Fall or balance concerns reported    No   Warm-up and Cool-down Performed on first and last piece of equipment   Resistance Training Performed No  left early for doctor's appointment   VAD Patient? No     Pain Assessment   Currently in Pain? No/denies   Multiple Pain Sites No         Goals Met:  Independence with exercise equipment Exercise tolerated well No report of cardiac concerns or symptoms Strength training completed today  Goals Unmet:  Not Applicable  Comments: Pt able to follow exercise prescription today without complaint.  Will continue to monitor for progression.    Dr. Emily Filbert is Medical Director for [place] and [facility].

## 2015-10-10 ENCOUNTER — Encounter: Payer: Medicare HMO | Admitting: *Deleted

## 2015-10-10 DIAGNOSIS — I2089 Other forms of angina pectoris: Secondary | ICD-10-CM

## 2015-10-10 DIAGNOSIS — I208 Other forms of angina pectoris: Secondary | ICD-10-CM

## 2015-10-10 NOTE — Progress Notes (Signed)
Daily Session Note  Patient Details  Name: Bradley Barnes. MRN: [id number] Date of Birth: 1940/03/14 Referring Provider:   Flowsheet Row Cardiac Rehab from 08/07/2015 in [facility] Cardiac and Pulmonary Rehab  Referring Provider  [name]      Encounter Date: 10/10/2015  Check In:     Session Check In - 10/10/15 0835      Check-In   Location [facility]-Cardiac & Pulmonary Rehab   Staff Present Heath Lark, RN, BSN, CCRP;Carlyn Lemke Luan Pulling, MA, ACSM RCEP, Exercise Physiologist;Carroll Enterkin, RN, BSN   Supervising physician immediately available to respond to emergencies See telemetry face sheet for immediately available ER MD   Medication changes reported     No   Fall or balance concerns reported    No   Warm-up and Cool-down Performed on first and last piece of equipment   Resistance Training Performed Yes   VAD Patient? No     Pain Assessment   Currently in Pain? No/denies   Multiple Pain Sites No         Goals Met:  Independence with exercise equipment Exercise tolerated well No report of cardiac concerns or symptoms Strength training completed today  Goals Unmet:  Not Applicable  Comments: Pt able to follow exercise prescription today without complaint.  Will continue to monitor for progression.    Dr. Emily Filbert is Medical Director for [place] and [facility].

## 2015-10-12 ENCOUNTER — Encounter: Payer: Medicare HMO | Admitting: *Deleted

## 2015-10-12 VITALS — Ht 74.5 in | Wt 307.9 lb

## 2015-10-12 DIAGNOSIS — I208 Other forms of angina pectoris: Secondary | ICD-10-CM | POA: Diagnosis not present

## 2015-10-12 DIAGNOSIS — I2089 Other forms of angina pectoris: Secondary | ICD-10-CM

## 2015-10-12 NOTE — Progress Notes (Signed)
Daily Session Note  Patient Details  Name: [name]. MRN: [id number] Date of Birth: 14-Aug-1940 Referring Provider:   Flowsheet Row Cardiac Rehab from 08/07/2015 in [facility] Cardiac and Pulmonary Rehab  Referring Provider  [name]      Encounter Date: 10/12/2015  Check In:     Session Check In - 10/12/15 1210      Check-In   Location [facility]-Cardiac & Pulmonary Rehab   Staff Present Gerlene Burdock, RN, Vickki Hearing, BA, ACSM CEP, Exercise Physiologist;Jayvier Burgher Luan Pulling, MA, ACSM RCEP, Exercise Physiologist   Supervising physician immediately available to respond to emergencies See telemetry face sheet for immediately available ER MD   Medication changes reported     No   Fall or balance concerns reported    No   Warm-up and Cool-down Performed on first and last piece of equipment   Resistance Training Performed Yes   VAD Patient? No     Pain Assessment   Currently in Pain? No/denies   Multiple Pain Sites No         Goals Met:  Independence with exercise equipment Exercise tolerated well No report of cardiac concerns or symptoms Strength training completed today  Goals Unmet:  Not Applicable  Comments: Pt able to follow exercise prescription today without complaint.  Will continue to monitor for progression.    Dr. Emily Filbert is Medical Director for [place] and [facility].

## 2015-10-19 ENCOUNTER — Ambulatory Visit: Payer: Medicare HMO | Attending: Internal Medicine

## 2015-10-19 DIAGNOSIS — I208 Other forms of angina pectoris: Secondary | ICD-10-CM | POA: Diagnosis not present

## 2015-10-19 DIAGNOSIS — G4733 Obstructive sleep apnea (adult) (pediatric): Secondary | ICD-10-CM | POA: Insufficient documentation

## 2015-10-19 DIAGNOSIS — I2089 Other forms of angina pectoris: Secondary | ICD-10-CM

## 2015-10-19 NOTE — Progress Notes (Signed)
Daily Session Note  Patient Details  Name: Bradley Barnes. MRN: [id number] Date of Birth: Jan 01, 1941 Referring Provider:   Flowsheet Row Cardiac Rehab from 08/07/2015 in [facility] Cardiac and Pulmonary Rehab  Referring Provider  [name]      Encounter Date: 10/19/2015  Check In:     Session Check In - 10/19/15 1016      Check-In   Location [facility]-Cardiac & Pulmonary Rehab   Staff Present Heath Lark, RN, BSN, CCRP;Laureen Owens Shark, BS, RRT, Respiratory Dareen Piano, BA, ACSM CEP, Exercise Physiologist   Supervising physician immediately available to respond to emergencies See telemetry face sheet for immediately available ER MD         Goals Met:  Independence with exercise equipment Exercise tolerated well No report of cardiac concerns or symptoms Strength training completed today  Goals Unmet:  Not Applicable  Comments: Pt able to follow exercise prescription today without complaint.  Will continue to monitor for progression.    Dr. Emily Filbert is Medical Director for [place] and [facility].

## 2015-10-24 ENCOUNTER — Encounter: Payer: Medicare HMO | Attending: Internal Medicine | Admitting: *Deleted

## 2015-10-24 DIAGNOSIS — I2089 Other forms of angina pectoris: Secondary | ICD-10-CM

## 2015-10-24 DIAGNOSIS — I208 Other forms of angina pectoris: Secondary | ICD-10-CM | POA: Diagnosis not present

## 2015-10-24 NOTE — Progress Notes (Signed)
Daily Session Note  Patient Details  Name: Bradley Barnes. MRN: [id number] Date of Birth: 02/11/1940 Referring Provider:   Flowsheet Row Cardiac Rehab from 08/07/2015 in [facility] Cardiac and Pulmonary Rehab  Referring Provider  [name]      Encounter Date: 10/24/2015  Check In:     Session Check In - 10/24/15 7846      Check-In   Staff Present Carson Myrtle, BS, RRT, Respiratory Therapist;Susanne Bice, RN, BSN, CCRP;Jessica Luan Pulling, MA, ACSM RCEP, Exercise Physiologist   Supervising physician immediately available to respond to emergencies See telemetry face sheet for immediately available ER MD   Medication changes reported     No   Fall or balance concerns reported    No   Warm-up and Cool-down Performed on first and last piece of equipment   Resistance Training Performed Yes   VAD Patient? No     Pain Assessment   Currently in Pain? No/denies         Goals Met:  Independence with exercise equipment Exercise tolerated well No report of cardiac concerns or symptoms Strength training completed today  Goals Unmet:  Not Applicable  Comments: Pt able to follow exercise prescription today without complaint.  Will continue to monitor for progression.    Dr. Emily Filbert is Medical Director for [place] and [facility].

## 2015-10-26 ENCOUNTER — Telehealth: Payer: Self-pay | Admitting: *Deleted

## 2015-10-26 DIAGNOSIS — I208 Other forms of angina pectoris: Secondary | ICD-10-CM | POA: Diagnosis not present

## 2015-10-26 DIAGNOSIS — G4733 Obstructive sleep apnea (adult) (pediatric): Secondary | ICD-10-CM

## 2015-10-26 NOTE — Progress Notes (Signed)
Daily Session Note  Patient Details  Name: Bradley Barnes. MRN: [id number] Date of Birth: February 28, 1940 Referring Provider:   Flowsheet Row Cardiac Rehab from 08/07/2015 in [facility] Cardiac and Pulmonary Rehab  Referring Provider  [name]      Encounter Date: 10/26/2015  Check In:     Session Check In - 10/26/15 0841      Check-In   Location [facility]-Cardiac & Pulmonary Rehab   Staff Present Gerlene Burdock, RN, Vickki Hearing, BA, ACSM CEP, Exercise Physiologist;Jessica Luan Pulling, MA, ACSM RCEP, Exercise Physiologist   Supervising physician immediately available to respond to emergencies See telemetry face sheet for immediately available ER MD   Medication changes reported     No   Fall or balance concerns reported    No   Warm-up and Cool-down Performed on first and last piece of equipment   Resistance Training Performed Yes   VAD Patient? No     Pain Assessment   Currently in Pain? No/denies   Multiple Pain Sites No           Exercise Prescription Changes - 10/25/15 1500      Exercise Review   Progression Yes     Response to Exercise   Blood Pressure (Admit) 134/70   Blood Pressure (Exercise) 160/88   Blood Pressure (Exit) 122/66   Heart Rate (Admit) 73 bpm   Heart Rate (Exercise) 103 bpm   Heart Rate (Exit) 71 bpm   Rating of Perceived Exertion (Exercise) 14   Symptoms none   Comments Home Exercise Guidelines given 09/14/15   Duration Progress to 45 minutes of aerobic exercise without signs/symptoms of physical distress   Intensity THRR unchanged     Progression   Progression Continue to progress workloads to maintain intensity without signs/symptoms of physical distress.   Average METs 2.3     Resistance Training   Training Prescription Yes   Weight 4 lbs   Reps 10-15     Interval Training   Interval Training No     Bike   Level 0.8   Minutes 15   METs 1.97     NuStep   Level 5   Minutes 15   METs 1.9     T5 Nustep   Level 5   Minutes 15    METs 2.7     Home Exercise Plan   Plans to continue exercise at [employer] (comment)  walk at [facility]   Frequency Add 2 additional days to program exercise sessions.      Goals Met:  Independence with exercise equipment Exercise tolerated well No report of cardiac concerns or symptoms Strength training completed today  Goals Unmet:  Not Applicable  Comments: Pt able to follow exercise prescription today without complaint.  Will continue to monitor for progression.    Dr. Emily Filbert is Medical Director for [place] and [facility].

## 2015-10-26 NOTE — Telephone Encounter (Signed)
-----   Message from Shane CrutchPradeep Ramachandran, MD sent at 10/25/2015  3:40 PM EDT ----- Regarding: CPAP titration Patient needs CPAP @ 20 cm.

## 2015-10-26 NOTE — Telephone Encounter (Signed)
Informed pt we would be placing order to have CPAP setting increased to 20cm. Nothing further needed.

## 2015-10-31 ENCOUNTER — Encounter: Payer: Medicare HMO | Admitting: *Deleted

## 2015-10-31 DIAGNOSIS — I208 Other forms of angina pectoris: Secondary | ICD-10-CM | POA: Diagnosis not present

## 2015-10-31 DIAGNOSIS — I2089 Other forms of angina pectoris: Secondary | ICD-10-CM

## 2015-10-31 NOTE — Progress Notes (Signed)
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 Sex-Intimacy & Heart Disease, Get SMART - Goal Setting: - Group verbal and written instruction through game format to discuss heart disease and the return to sexual intimacy. Provides group verbal and written material to discuss and apply goal setting through the application of the S.M.A.R.T. Method. Flowsheet Row Cardiac Rehab from 10/31/2015 in [facility] Cardiac and Pulmonary Rehab  Date  09/19/15  Educator  CE  Instruction Review Code  2- meets goals/outcomes      Other Matters of the Heart: - Provides group verbal, written materials and models to describe Heart Failure, Angina, Valve Disease, and Diabetes in the realm of heart disease. Includes description of the disease process  and treatment options available to the cardiac patient. Flowsheet Row Cardiac Rehab from 10/31/2015 in [facility] Cardiac and Pulmonary Rehab  Date  09/12/15  Educator  SB  Instruction Review Code  2- meets goals/outcomes      Exercise & Equipment Safety: - Individual verbal instruction and demonstration of equipment use and safety with use of the equipment.   Infection Prevention: - Provides verbal and written material to individual with discussion of infection control including proper hand washing and proper equipment cleaning during exercise session.   Falls Prevention: - Provides verbal and written material to individual with discussion of falls prevention and safety.   Diabetes: - Individual verbal and written instruction to review signs/symptoms of diabetes, desired ranges of glucose level fasting, after meals and with exercise. Advice that pre and post exercise glucose checks will be done for 3 sessions at entry of program.    Knowledge Questionnaire Score:     Knowledge Questionnaire Score - 09/26/15 1541      Knowledge Questionnaire Score   Pre Score 27/28   Post Score 27/28      Core Components/Risk Factors/Patient Goals at Admission:     Personal Goals and Risk Factors at Admission - 08/07/15 1516      Core Components/Risk Factors/Patient Goals on Admission    Weight Management Yes;Obesity   Intervention Weight Management: Develop a combined nutrition and exercise program designed to reach desired caloric intake, while maintaining appropriate intake of nutrient and fiber, sodium and fats, and appropriate energy expenditure required for the weight goal.;Weight Management: Provide education and appropriate resources to help participant work on and attain dietary goals.;Weight Management/Obesity: Establish reasonable short term and long term weight goals.;Obesity: Provide education and appropriate resources to help participant work on and attain dietary goals.   Admit  Weight 308 lb 4.8 oz (139.8 kg)   Goal Weight: Short Term 300 lb (136.1 kg)   Goal Weight: Long Term 220 lb (99.8 kg)   Expected Outcomes Short Term: Continue to assess and modify interventions until short term weight is achieved;Long Term: Adherence to nutrition and physical activity/exercise program aimed toward attainment of established weight goal   Sedentary Yes  has had foot surgery x 2 since sept 2015, been sedentary since   Intervention Provide advice, education, support and counseling about physical activity/exercise needs.;Develop an individualized exercise prescription for aerobic and resistive training based on initial evaluation findings, risk stratification, comorbidities and participant's personal goals.   Expected Outcomes Achievement of increased cardiorespiratory fitness and enhanced flexibility, muscular endurance and strength shown through measurements of functional capacity and personal statement of participant.   Increase Strength and Stamina Yes   Intervention Provide advice, education, support and counseling about physical activity/exercise needs.;Develop an individualized exercise prescription for aerobic and resistive  training based on initial evaluation findings, risk stratification, comorbidities and participant's personal goals.   Expected Outcomes Achievement of increased cardiorespiratory fitness and enhanced flexibility, muscular endurance and strength shown through measurements of functional capacity and personal statement of participant.   Hypertension Yes   Intervention Provide education on lifestyle modifcations including regular physical activity/exercise, weight management, moderate sodium restriction and increased consumption of fresh fruit, vegetables, and low fat dairy, alcohol moderation, and smoking cessation.;Monitor prescription use compliance.   Expected Outcomes Short Term: Continued assessment and intervention until BP is < 140/68m HG in hypertensive  participants. < 130/821mHG in hypertensive participants with diabetes, heart failure or chronic kidney disease.;Long Term: Maintenance of blood pressure at goal levels.   Lipids Yes   Intervention Provide education and support for participant on nutrition & aerobic/resistive exercise along with prescribed medications to achieve LDL <7075mHDL >33m25m Expected Outcomes Short Term: Participant states understanding of desired cholesterol values and is compliant with medications prescribed. Participant is following exercise prescription and nutrition guidelines.;Long Term: Cholesterol controlled with medications as prescribed, with individualized exercise RX and with personalized nutrition plan. Value goals: LDL < 70mg70mL > 40 mg.      Core Components/Risk Factors/Patient Goals Review:      Goals and Risk Factor Review    Row Name 08/31/15 0952 [id number] 10/10/15 1033         Core Components/Risk Factors/Patient Goals Review   Personal Goals Review Weight Management/Obesity;Sedentary;Increase Strength and Stamina;Lipids;Hypertension Weight Management/Obesity;Sedentary;Increase Strength and Stamina;Hypertension;Lipids;Stress Weight Management/Obesity;Sedentary;Increase Strength and Stamina;Hypertension;Lipids;Stress     Review Bill's weight was up today 2 lbs.  He did admit to cheating on his diet with pizza yesterday.  In general, it had been trending down.  He is not checking his blood pressure at home, but it also was up some today (140s).  He is taking his statin with no problems.  He has been doing better with his diet overall and watching his portion sizes.  His stress was a up some today as he has to go to court on Monday. Bill's weight was up again some today.  He has been cutting back on portions for dinner. He did have some cookies that a neighbor had cooked for him.  His blood pressure has been good here, but his is not check it or his blood sugars at home.  His stress levels are back  to normal.  He has not had any problems with taking his statin.  He is exercising at home by walking two extra days a week. Bill's weight is starting to go down some.  His blood pressures have been good.  He is doing well on his statin and coping well with stress.  He is getting better about watching his portion sizes.  He is still feeling weak and SOB, but he has a sleep study next week and a pulmonary appointment on Oct 26..  HeMarland Kitchenis walking at home twice a week to try to improve, but is unable to tolerate more do his back.     Expected Outcomes Bill Rush [place] continue to come to class for exercise and education.  We will continue to monitor his weight and blood pressure. Bill Rush [place] continue to come to class for exercise and education.  We will continue to monitor his weight and blood pressure. Bill Rush [place] continue to come to class for exercise and education.  We will continue to monitor his weight and blood pressures.  Core Components/Risk Factors/Patient Goals at Discharge (Final Review):      Goals and Risk Factor Review - 10/10/15 1033      Core Components/Risk Factors/Patient Goals Review   Personal Goals Review Weight Management/Obesity;Sedentary;Increase Strength and Stamina;Hypertension;Lipids;Stress   Review Bill's weight is starting to go down some.  His blood pressures have been good.  He is doing well on his statin and coping well with stress.  He is getting better about watching his portion sizes.  He is still feeling weak and SOB, but he has a sleep study next week and a pulmonary appointment on Oct 26.Marland Kitchen  He is walking at home twice a week to try to improve, but is unable to tolerate more do his back.   Expected Outcomes Rush Landmark will continue to come to class for exercise and education.  We will continue to monitor his weight and blood pressures.      ITP Comments:     ITP Comments    Row Name 08/07/15 1503 09/06/15 0728 09/14/15 1034 10/04/15 0640 10/31/15 1057   ITP Comments medical  review and intial ITP completed.  Doscumnetation of diagnosis is found [facility] 07/28/2015 office visit 30 day review. Continue with ITP unless changes noted by Medical Director at signature of review. Pt mentioned today that he took two nitroglycerin pills at home two days ago.  He was encouraged to contact his cardiologist to let them know as well. 30 day review. Continue with ITP unless changes noted by Medical Director at signature of review. Discharged today      Comments: Discharged

## 2015-10-31 NOTE — Patient Instructions (Signed)
Discharge Instructions  Patient Details  Name: Bradley Barnes. MRN: [id number] Date of Birth: 1940/10/10 Referring Provider:  Irena Reichmann, MD   Number of Visits: 36  Reason for Discharge:  Patient reached a stable level of exercise. Patient independent in their exercise.  Smoking History:  History  Smoking Status  . Former Smoker  . Packs/day: 1.00  . Years: 33.00  . Types: Cigarettes  . Quit date: 10/03/1991  Smokeless Tobacco  . Never Used    Diagnosis:  No diagnosis found.  Initial Exercise Prescription:     Initial Exercise Prescription - 08/07/15 1400      Date of Initial Exercise RX and Referring Provider   Date 08/07/15   Referring Provider [name]     Treadmill   MPH 1   Grade 0   Minutes 5  build to 10   METs 1.77     NuStep   Level 2   Minutes 15   METs 1.6     Arm Ergometer   Level 1   Minutes 15   METs 1.6     Recumbant Elliptical   Level 1   RPM 60   Minutes 15   METs 2     T5 Nustep   Level 2   Minutes 15   METs 2     Biostep-RELP   Level 2   Minutes 15   METs 2     Prescription Details   Frequency (times per week) 3   Duration Progress to 30 minutes of continuous aerobic without signs/symptoms of physical distress     Intensity   THRR 40-80% of Max Heartrate 92-127   Ratings of Perceived Exertion 11-13     Progression   Progression Continue to progress workloads to maintain intensity without signs/symptoms of physical distress.     Resistance Training   Training Prescription Yes   Weight 3   Reps 10-15      Discharge Exercise Prescription (Final Exercise Prescription Changes):     Exercise Prescription Changes - 10/25/15 1500      Exercise Review   Progression Yes     Response to Exercise   Blood Pressure (Admit) 134/70   Blood Pressure (Exercise) 160/88   Blood Pressure (Exit) 122/66   Heart Rate (Admit) 73 bpm   Heart Rate (Exercise) 103 bpm   Heart Rate (Exit) 71 bpm   Rating of  Perceived Exertion (Exercise) 14   Symptoms none   Comments Home Exercise Guidelines given 09/14/15   Duration Progress to 45 minutes of aerobic exercise without signs/symptoms of physical distress   Intensity THRR unchanged     Progression   Progression Continue to progress workloads to maintain intensity without signs/symptoms of physical distress.   Average METs 2.3     Resistance Training   Training Prescription Yes   Weight 4 lbs   Reps 10-15     Interval Training   Interval Training No     Bike   Level 0.8   Minutes 15   METs 1.97     NuStep   Level 5   Minutes 15   METs 1.9     T5 Nustep   Level 5   Minutes 15   METs 2.7     Home Exercise Plan   Plans to continue exercise at [employer] (comment)  walk at [facility]   Frequency Add 2 additional days to program exercise sessions.      Functional Capacity:  6 Minute Walk    Row Name 08/07/15 1439 10/12/15 1508       6 Minute Walk   Phase  - Discharge    Distance 858 feet 955 feet    Distance % Change  - 11.3 %    Walk Time 6 minutes 6 minutes    # of Rest Breaks  - 0    MPH 1.63 1.81    METS 1.3 1.46    RPE 15 17    VO2 Peak 4.53 5.12    Symptoms Yes (comment) Yes (comment)    Comments short of breath and right R knee and hip hurt  short of breath and right R knee and hip hurt     Resting HR 56 bpm 94 bpm    Resting BP 124/60 112/54    Max Ex. HR 107 bpm 96 bpm    Max Ex. BP 142/68 158/74      Interval Oxygen   Interval Oxygen?  - Yes    Baseline Oxygen Saturation %  - 96 %    Baseline Liters of Oxygen  - 0 L  Room Air    1 Minute Oxygen Saturation %  - 96 %    1 Minute Liters of Oxygen  - 0 L    2 Minute Oxygen Saturation %  - 95 %    2 Minute Liters of Oxygen  - 0 L    3 Minute Oxygen Saturation %  - 94 %    3 Minute Liters of Oxygen  - 0 L    4 Minute Oxygen Saturation %  - 94 %    4 Minute Liters of Oxygen  - 0 L    5 Minute Oxygen Saturation %  - 94 %    5  Minute Liters of Oxygen  - 0 L    6 Minute Oxygen Saturation %  - 94 %    6 Minute Liters of Oxygen  - 0 L    2 Minute Post Oxygen Saturation %  - 96 %    2 Minute Post Liters of Oxygen  - 0 L       Quality of Life:     Quality of Life - 09/26/15 1541      Quality of Life Scores   Health/Function Pre 17.5 %   Health/Function Post 18.37 %   Health/Function % Change 4.97 %   Socioeconomic Pre 24.25 %   Socioeconomic Post 22.07 %   Socioeconomic % Change  -8.99 %   Psych/Spiritual Pre 23.5 %   Psych/Spiritual Post 23.29 %   Psych/Spiritual % Change -0.89 %   Family Pre 20.3 %   Family Post 24 %   Family % Change 18.23 %   GLOBAL Pre 20.42 %   GLOBAL Post 20.97 %   GLOBAL % Change 2.69 %      Personal Goals: Goals established at orientation with interventions provided to work toward goal.     Personal Goals and Risk Factors at Admission - 08/07/15 1516      Core Components/Risk Factors/Patient Goals on Admission    Weight Management Yes;Obesity   Intervention Weight Management: Develop a combined nutrition and exercise program designed to reach desired caloric intake, while maintaining appropriate intake of nutrient and fiber, sodium and fats, and appropriate energy expenditure required for the weight goal.;Weight Management: Provide education and appropriate resources to help participant work on and attain dietary goals.;Weight Management/Obesity: Establish reasonable short term and long  term weight goals.;Obesity: Provide education and appropriate resources to help participant work on and attain dietary goals.   Admit Weight 308 lb 4.8 oz (139.8 kg)   Goal Weight: Short Term 300 lb (136.1 kg)   Goal Weight: Long Term 220 lb (99.8 kg)   Expected Outcomes Short Term: Continue to assess and modify interventions until short term weight is achieved;Long Term: Adherence to nutrition and physical activity/exercise program aimed toward attainment of established weight goal    Sedentary Yes  has had foot surgery x 2 since sept 2015, been sedentary since   Intervention Provide advice, education, support and counseling about physical activity/exercise needs.;Develop an individualized exercise prescription for aerobic and resistive training based on initial evaluation findings, risk stratification, comorbidities and participant's personal goals.   Expected Outcomes Achievement of increased cardiorespiratory fitness and enhanced flexibility, muscular endurance and strength shown through measurements of functional capacity and personal statement of participant.   Increase Strength and Stamina Yes   Intervention Provide advice, education, support and counseling about physical activity/exercise needs.;Develop an individualized exercise prescription for aerobic and resistive training based on initial evaluation findings, risk stratification, comorbidities and participant's personal goals.   Expected Outcomes Achievement of increased cardiorespiratory fitness and enhanced flexibility, muscular endurance and strength shown through measurements of functional capacity and personal statement of participant.   Hypertension Yes   Intervention Provide education on lifestyle modifcations including regular physical activity/exercise, weight management, moderate sodium restriction and increased consumption of fresh fruit, vegetables, and low fat dairy, alcohol moderation, and smoking cessation.;Monitor prescription use compliance.   Expected Outcomes Short Term: Continued assessment and intervention until BP is < 140/7290mm HG in hypertensive participants. < 130/4280mm HG in hypertensive participants with diabetes, heart failure or chronic kidney disease.;Long Term: Maintenance of blood pressure at goal levels.   Lipids Yes   Intervention Provide education and support for participant on nutrition & aerobic/resistive exercise along with prescribed medications to achieve LDL 70mg , HDL >40mg .    Expected Outcomes Short Term: Participant states understanding of desired cholesterol values and is compliant with medications prescribed. Participant is following exercise prescription and nutrition guidelines.;Long Term: Cholesterol controlled with medications as prescribed, with individualized exercise RX and with personalized nutrition plan. Value goals: LDL < 70mg , HDL > 40 mg.       Personal Goals Discharge:     Goals and Risk Factor Review - 10/10/15 1033      Core Components/Risk Factors/Patient Goals Review   Personal Goals Review Weight Management/Obesity;Sedentary;Increase Strength and Stamina;Hypertension;Lipids;Stress   Review Bill's weight is starting to go down some.  His blood pressures have been good.  He is doing well on his statin and coping well with stress.  He is getting better about watching his portion sizes.  He is still feeling weak and SOB, but he has a sleep study next week and a pulmonary appointment on Oct 26.Marland Kitchen.  He is walking at home twice a week to try to improve, but is unable to tolerate more do his back.   Expected Outcomes Annette StableBill will continue to come to class for exercise and education.  We will continue to monitor his weight and blood pressures.      Nutrition & Weight - Outcomes:     Pre Biometrics - 08/07/15 1438      Pre Biometrics   Height 6' 2.5" (1.892 m)   Weight (!)  308 lb (139.7 kg)   Waist Circumference 55 inches   Hip Circumference 51.25 inches   Waist to  Hip Ratio 1.07 %   BMI (Calculated) 39.1         Post Biometrics - 10/12/15 1510       Post  Biometrics   Height 6' 2.5" (1.892 m)   Weight (!)  307 lb 14.4 oz (139.7 kg)   Waist Circumference 55 inches   Hip Circumference 50.75 inches   Waist to Hip Ratio 1.08 %   BMI (Calculated) 39.1      Nutrition:     Nutrition Therapy & Goals - 08/07/15 1548      Intervention Plan   Intervention Prescribe, educate and counsel regarding individualized specific dietary modifications  aiming towards targeted core components such as weight, hypertension, lipid management, diabetes, heart failure and other comorbidities.   Expected Outcomes Short Term Goal: Understand basic principles of dietary content, such as calories, fat, sodium, cholesterol and nutrients.;Short Term Goal: A plan has been developed with personal nutrition goals set during dietitian appointment.;Long Term Goal: Adherence to prescribed nutrition plan.      Nutrition Discharge:     Nutrition Assessments - 09/26/15 1541      Rate Your Plate Scores   Pre Score 67   Pre Score % 74 %   Post Score 69   Post Score % 77 %   % Change 3 %      Education Questionnaire Score:     Knowledge Questionnaire Score - 09/26/15 1541      Knowledge Questionnaire Score   Pre Score 27/28   Post Score 27/28      Goals reviewed with patient; copy given to patient.

## 2015-10-31 NOTE — Progress Notes (Signed)
Discharge Summary  Patient Details  Name: Bradley ChamberlainWilliam C Tewksbury Jr. MRN: [id number] Date of Birth: 1941-01-12 Referring Provider:   Flowsheet Row Cardiac [facility]  Referring Provider  [name]       Number of Visits: [phone]   Reason for Discharge:  Patient reached a stable level of exercise. Patient independent in their exercise.  Smoking History:  History  Smoking Status  . Former Smoker  . Packs/day: 1.00  . Years: 33.00  . Types: Cigarettes  . Quit date: 10/03/1991  Smokeless Tobacco  . Never Used    Diagnosis:  Stable angina (HCC)  ADL UCSD:   Initial Exercise Prescription:     Initial Exercise Prescription - 08/07/15 1400      Date of Initial Exercise RX and Referring Provider   Date 08/07/15   Referring Provider [name]     Treadmill   MPH 1   Grade 0   Minutes 5  build to 10   METs 1.77     NuStep   Level 2   Minutes 15   METs 1.6     Arm Ergometer   Level 1   Minutes 15   METs 1.6     Recumbant Elliptical   Level 1   RPM 60   Minutes 15   METs 2     T5 Nustep   Level 2   Minutes 15   METs 2     Biostep-RELP   Level 2   Minutes 15   METs 2     Prescription Details   Frequency (times per week) 3   Duration Progress to 30 minutes of continuous aerobic without signs/symptoms of physical distress     Intensity   THRR 40-80% of Max Heartrate 92-127   Ratings of Perceived Exertion 11-13     Progression   Progression Continue to progress workloads to maintain intensity without signs/symptoms of physical distress.     Resistance Training   Training Prescription Yes   Weight 3   Reps 10-15      Discharge Exercise Prescription (Final Exercise Prescription Changes):     Exercise Prescription Changes - 10/25/15 1500      Exercise Review   Progression Yes     Response to Exercise   Blood Pressure (Admit) 134/70   Blood Pressure (Exercise) 160/88   Blood Pressure (Exit) 122/66   Heart Rate (Admit) 73 bpm   Heart Rate (Exercise) 103 bpm   Heart Rate (Exit) 71 bpm   Rating of Perceived Exertion (Exercise) 14   Symptoms none   Comments Home Exercise Guidelines given 09/14/15   Duration Progress to 45 minutes of aerobic exercise without signs/symptoms of physical distress   Intensity THRR unchanged     Progression   Progression Continue to progress workloads to maintain intensity without signs/symptoms of physical distress.   Average METs 2.3     Resistance Training   Training Prescription Yes   Weight 4 lbs   Reps 10-15     Interval Training   Interval Training No     Bike   Level 0.8   Minutes 15   METs 1.97     NuStep   Level 5   Minutes 15   METs 1.9     T5 Nustep   Level 5   Minutes 15   METs 2.7     Home Exercise Plan   [facility]  [facility]  [facility]   Frequency Add 2 additional days to program exercise sessions.      Functional Capacity:     6 Minute Walk    Row Name 08/07/15 1439 10/12/15 1508       6 Minute Walk   Phase  - Discharge    Distance 858 feet 955 feet    Distance % Change  - 11.3 %    Walk Time 6 minutes 6 minutes    # of Rest Breaks  - 0    MPH 1.63 1.81    METS 1.3 1.46    RPE 15 17    VO2 Peak 4.53 5.12    Symptoms Yes (comment) Yes (comment)    Comments short of breath and right R knee and hip hurt  short of breath and right R knee and hip hurt     Resting HR 56 bpm 94 bpm    Resting BP 124/60 112/54    Max Ex. HR 107 bpm 96 bpm    Max Ex. BP 142/68 158/74      Interval Oxygen   Interval Oxygen?  - Yes    Baseline Oxygen Saturation %  - 96 %    Baseline Liters of Oxygen  - 0 L  Room Air    1 Minute Oxygen Saturation %  - 96 %    1 Minute Liters of Oxygen  - 0 L    2 Minute Oxygen Saturation %  - 95 %    2 Minute Liters of Oxygen  - 0 L    3 Minute Oxygen Saturation %  - 94 %    3 Minute Liters of Oxygen  - 0 L    4 Minute Oxygen  Saturation %  - 94 %    4 Minute Liters of Oxygen  - 0 L    5 Minute Oxygen Saturation %  - 94 %    5 Minute Liters of Oxygen  - 0 L    6 Minute Oxygen Saturation %  - 94 %    6 Minute Liters of Oxygen  - 0 L    2 Minute Post Oxygen Saturation %  - 96 %    2 Minute Post Liters of Oxygen  - 0 L       Psychological, QOL, Others - Outcomes: PHQ 2/9: Depression screen [facility] 2/9 09/26/2015 08/07/2015  Decreased Interest 1 1  Down, Depressed, Hopeless 0 0  PHQ - 2 Score 1 1  Altered sleeping 2 1  Tired, decreased energy 1 1  Change in appetite 2 0  Feeling bad or failure about yourself  0 0  Trouble concentrating 1 1  Moving slowly or fidgety/restless 1 0  Suicidal thoughts 0 0  PHQ-9 Score 8 4  Difficult doing work/chores Somewhat difficult Somewhat difficult    Quality of Life:     Quality of Life - 09/26/15 1541      Quality of Life Scores   Health/Function Pre 17.5 %   Health/Function Post 18.37 %   Health/Function % Change 4.97 %   Socioeconomic Pre 24.25 %   Socioeconomic Post 22.07 %   Socioeconomic % Change  -8.99 %   Psych/Spiritual Pre 23.5 %   Psych/Spiritual Post 23.29 %   Psych/Spiritual % Change -0.89 %   Family Pre 20.3 %   Family Post 24 %   Family % Change 18.23 %   GLOBAL Pre 20.42 %   GLOBAL Post 20.97 %  GLOBAL % Change 2.69 %      Personal Goals: Goals established at orientation with interventions provided to work toward goal.     Personal Goals and Risk Factors at Admission - 08/07/15 1516      Core Components/Risk Factors/Patient Goals on Admission    Weight Management Yes;Obesity   Intervention Weight Management: Develop a combined nutrition and exercise program designed to reach desired caloric intake, while maintaining appropriate intake of nutrient and fiber, sodium and fats, and appropriate energy expenditure required for the weight goal.;Weight Management: Provide education and appropriate resources to help participant work on and attain  dietary goals.;Weight Management/Obesity: Establish reasonable short term and long term weight goals.;Obesity: Provide education and appropriate resources to help participant work on and attain dietary goals.   Admit Weight 308 lb 4.8 oz (139.8 kg)   Goal Weight: Short Term 300 lb (136.1 kg)   Goal Weight: Long Term 220 lb (99.8 kg)   Expected Outcomes Short Term: Continue to assess and modify interventions until short term weight is achieved;Long Term: Adherence to nutrition and physical activity/exercise program aimed toward attainment of established weight goal   Sedentary Yes  has had foot surgery x 2 since sept 2015, been sedentary since   Intervention Provide advice, education, support and counseling about physical activity/exercise needs.;Develop an individualized exercise prescription for aerobic and resistive training based on initial evaluation findings, risk stratification, comorbidities and participant's personal goals.   Expected Outcomes Achievement of increased cardiorespiratory fitness and enhanced flexibility, muscular endurance and strength shown through measurements of functional capacity and personal statement of participant.   Increase Strength and Stamina Yes   Intervention Provide advice, education, support and counseling about physical activity/exercise needs.;Develop an individualized exercise prescription for aerobic and resistive training based on initial evaluation findings, risk stratification, comorbidities and participant's personal goals.   Expected Outcomes Achievement of increased cardiorespiratory fitness and enhanced flexibility, muscular endurance and strength shown through measurements of functional capacity and personal statement of participant.   Hypertension Yes   Intervention Provide education on lifestyle modifcations including regular physical activity/exercise, weight management, moderate sodium restriction and increased consumption of fresh fruit,  vegetables, and low fat dairy, alcohol moderation, and smoking cessation.;Monitor prescription use compliance.   Expected Outcomes Short Term: Continued assessment and intervention until BP is < 140/35mm HG in hypertensive participants. < 130/52mm HG in hypertensive participants with diabetes, heart failure or chronic kidney disease.;Long Term: Maintenance of blood pressure at goal levels.   Lipids Yes   Intervention Provide education and support for participant on nutrition & aerobic/resistive exercise along with prescribed medications to achieve LDL 70mg , HDL >40mg .   Expected Outcomes Short Term: Participant states understanding of desired cholesterol values and is compliant with medications prescribed. Participant is following exercise prescription and nutrition guidelines.;Long Term: Cholesterol controlled with medications as prescribed, with individualized exercise RX and with personalized nutrition plan. Value goals: LDL < 70mg , HDL > 40 mg.       Personal Goals Discharge:     Goals and Risk Factor Review    Row Name 08/31/15 5621 09/21/15 1043 10/10/15 1033         Core Components/Risk Factors/Patient Goals Review   Personal Goals Review Weight Management/Obesity;Sedentary;Increase Strength and Stamina;Lipids;Hypertension Weight Management/Obesity;Sedentary;Increase Strength and Stamina;Hypertension;Lipids;Stress Weight Management/Obesity;Sedentary;Increase Strength and Stamina;Hypertension;Lipids;Stress     Review Bill's weight was up today 2 lbs.  He did admit to cheating on his diet with pizza yesterday.  In general, it had been trending down.  He is  not checking his blood pressure at home, but it also was up some today (140s).  He is taking his statin with no problems.  He has been doing better with his diet overall and watching his portion sizes.  His stress was a up some today as he has to go to court on Monday. Bill's weight was up again some today.  He has been cutting back on  portions for dinner. He did have some cookies that a neighbor had cooked for him.  His blood pressure has been good here, but his is not check it or his blood sugars at home.  His stress levels are back to normal.  He has not had any problems with taking his statin.  He is exercising at home by walking two extra days a week. Bill's weight is starting to go down some.  His blood pressures have been good.  He is doing well on his statin and coping well with stress.  He is getting better about watching his portion sizes.  He is still feeling weak and SOB, but he has a sleep study next week and a pulmonary appointment on Oct 26.Marland Kitchen  He is walking at home twice a week to try to improve, but is unable to tolerate more do his back.     Expected Outcomes Annette Stable will continue to come to class for exercise and education.  We will continue to monitor his weight and blood pressure. Annette Stable will continue to come to class for exercise and education.  We will continue to monitor his weight and blood pressure. Annette Stable will continue to come to class for exercise and education.  We will continue to monitor his weight and blood pressures.        Nutrition & Weight - Outcomes:     Pre Biometrics - 08/07/15 1438      Pre Biometrics   Height 6' 2.5" (1.892 m)   Weight (!)  308 lb (139.7 kg)   Waist Circumference 55 inches   Hip Circumference 51.25 inches   Waist to Hip Ratio 1.07 %   BMI (Calculated) 39.1         Post Biometrics - 10/12/15 1510       Post  Biometrics   Height 6' 2.5" (1.892 m)   Weight (!)  307 lb 14.4 oz (139.7 kg)   Waist Circumference 55 inches   Hip Circumference 50.75 inches   Waist to Hip Ratio 1.08 %   BMI (Calculated) 39.1      Nutrition:     Nutrition Therapy & Goals - 08/07/15 1548      Intervention Plan   Intervention Prescribe, educate and counsel regarding individualized specific dietary modifications aiming towards targeted core components such as weight, hypertension, lipid  management, diabetes, heart failure and other comorbidities.   Expected Outcomes Short Term Goal: Understand basic principles of dietary content, such as calories, fat, sodium, cholesterol and nutrients.;Short Term Goal: A plan has been developed with personal nutrition goals set during dietitian appointment.;Long Term Goal: Adherence to prescribed nutrition plan.      Nutrition Discharge:     Nutrition Assessments - 09/26/15 1541      Rate Your Plate Scores   Pre Score 67   Pre Score % 74 %   Post Score 69   Post Score % 77 %   % Change 3 %      Education Questionnaire Score:     Knowledge Questionnaire Score - 09/26/15 1541  Knowledge Questionnaire Score   Pre Score 27/28   Post Score 27/28      Goals reviewed with patient; copy given to patient.

## 2015-10-31 NOTE — Progress Notes (Signed)
Daily Session Note  Patient Details  Name: Bradley Barnes. MRN: [id number] Date of Birth: [birth year] Referring Provider:   Flowsheet Row Cardiac Rehab from 08/07/2015 in [facility] Cardiac and Pulmonary Rehab  Referring Provider  [name]      Encounter Date: 10/31/2015  Check In:     Session Check In - 10/31/15 0835      Check-In   Staff Present Bradley Barnes, BS, RRT, Respiratory Therapist;Bradley Desautel, RN, BSN, CCRP;Bradley Luan Pulling, MA, ACSM RCEP, Exercise Physiologist   Supervising physician immediately available to respond to emergencies See telemetry face sheet for immediately available ER MD   Medication changes reported     No   Fall or balance concerns reported    No   Warm-up and Cool-down Performed on first and last piece of equipment   Resistance Training Performed Yes   VAD Patient? No     Pain Assessment   Currently in Pain? No/denies         Goals Met:  Independence with exercise equipment Personal goals reviewed No report of cardiac concerns or symptoms Strength training completed today  Goals Unmet:  Not Applicable  Comments:  Bradley Barnes graduated today from cardiac rehab with 36 sessions completed.  Details of the patient's exercise prescription and what He needs to do in order to continue the prescription and progress were discussed with patient.  Patient was given a copy of prescription and goals.  Patient verbalized understanding.  Bradley Barnes plans to continue to exercise by community gym 2 days a week and walks one day a week at home..     Dr. Emily Barnes is Medical Director for [place] [facility].

## 2015-11-16 ENCOUNTER — Encounter: Payer: Self-pay | Admitting: Internal Medicine

## 2015-11-16 ENCOUNTER — Ambulatory Visit (INDEPENDENT_AMBULATORY_CARE_PROVIDER_SITE_OTHER): Payer: Medicare HMO | Admitting: Internal Medicine

## 2015-11-16 MED ORDER — GLYCOPYRROLATE-FORMOTEROL 9-4.8 MCG/ACT IN AERO: 2.0000 | INHALATION_SPRAY | Freq: Two times a day (BID) | RESPIRATORY_TRACT | 5 refills | Status: AC

## 2015-11-16 NOTE — Progress Notes (Signed)
[facility] [place] Pulmonary Medicine Consultation      Date: 11/16/2015,   MRN# [id number] Bradley Barnes. 11-28-1940 Code Status:  Code Status History    This patient does not have a recorded code status. Please follow your organizational policy for patients in this situation.     Hosp day:@[email or web address] @ Referring MD: @[email or web address] @     PCP:      Admission                  Current  Bradley Barnes. is a 75 y.o. 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  Acetaminophen 500 MG coapsule, Take by mouth., Disp: , Rfl:  .  albuterol (PROAIR HFA) 108 (90 Base) MCG/ACT inhaler, Inhale into the lungs., Disp: , Rfl:  .  Alum Hydroxide-Mag Carbonate (GAVISCON EXTRA RELIEF FORMULA PO), Take by mouth., Disp: , Rfl:  .  amLODipine (NORVASC) 10 MG tablet, Take by mouth., Disp: , Rfl:  .  aspirin EC 81 MG tablet, Take by mouth., Disp: , Rfl:  .  atorvastatin (LIPITOR) 40 MG tablet, Take by mouth., Disp: , Rfl:  .  Investigational - Study Medication, Take by mouth. [facility] IDS, Disp: , Rfl:  .  isosorbide mononitrate (IMDUR) 60 MG 24 hr tablet, Take by mouth., Disp: , Rfl:  .  metoprolol (TOPROL-XL) 200 MG 24 hr tablet, Take by mouth., Disp: , Rfl:  .  Multiple Vitamin (MULTI-VITAMINS) TABS, Take by mouth., Disp: , Rfl:  .  nitroGLYCERIN (NITROSTAT) 0.4 MG SL tablet, Place under the tongue., Disp: , Rfl:    .  Omega-3 1000 MG CAPS, Take by mouth., Disp: , Rfl:  .  ramipril (ALTACE) 10 MG capsule, Take by mouth., Disp: , Rfl:  .  ranitidine (ZANTAC) 300 MG tablet, Take by mouth., Disp: , Rfl:  .  sodium chloride (OCEAN) 0.65 % nasal spray, Place 1 spray into the nose as needed for congestion., Disp: , Rfl:  .  sodium chloride (V-R NASAL SPRAY SALINE) 0.65 % nasal spray, , Disp: , Rfl:  .  tiotropium (SPIRIVA) 18 MCG inhalation capsule, Place into inhaler and inhale., Disp: , Rfl:     ALLERGIES   Penicillins; Promethazine; Erythromycin; Fluticasone furoate-vilanterol; Iron; Oxycodone; Proton pump inhibitors; Rosuvastatin; Tomato; Typhoid vaccines; Azithromycin; Sulfa antibiotics; and Testosterone     REVIEW OF SYSTEMS   Review of Systems  Constitutional: Positive for malaise/fatigue. Negative for chills, diaphoresis, fever and weight loss.  HENT: Negative for congestion and hearing loss.   Respiratory: Positive for shortness of breath. Negative for cough, hemoptysis, sputum production and wheezing.   Cardiovascular: Positive for leg swelling. Negative for chest pain, palpitations and orthopnea.  Gastrointestinal: Negative for heartburn.  Genitourinary: Negative for dysuria.  Skin: Negative for rash.  Neurological: Negative for weakness and headaches.  Endo/Heme/Allergies: Does  not bruise/bleed easily.  All other systems reviewed and are negative.   BP 128/78 (BP Location: Left Arm, Cuff Size: Normal)   Pulse 77   Ht 6\' 2"  (1.88 m)   Wt (!) 308 lb (139.7 kg)   SpO2 96%   BMI 39.54 kg/m     PHYSICAL EXAM  Physical Exam  Constitutional: He is oriented to person, place, and time. He appears well-developed and well-nourished. No distress.  HENT:  Head: Normocephalic and atraumatic.  Mouth/Throat: No oropharyngeal exudate.  Eyes: EOM are normal. Pupils are equal, round, and reactive to light. No scleral icterus.  Neck: Normal range of motion. Neck supple.  Cardiovascular:  Normal rate, regular rhythm and normal heart sounds.   No murmur heard. Pulmonary/Chest: No stridor. No respiratory distress. He has no wheezes.  Abdominal: Soft. Bowel sounds are normal.  Musculoskeletal: Normal range of motion. He exhibits edema.  Neurological: He is alert and oriented to person, place, and time. No cranial nerve deficit.  Skin: Skin is warm. He is not diaphoretic.  Psychiatric: He has a normal mood and affect.     IMAGING   CXR report reviewed no active disease noted  PFT 08/2015 Ratio 64% FEv1 55% DLCO 50% ONO/6MWT  Previous PFT 2015 Ratio 57% Fev1 1.9L 53%  PFT's   ASSESSMENT/PLAN   75 yo morbidly obese white male seen today for OSA on CPAP therapy and for Moderate COPD gold Stage B Overall, his resp status seems to be OK but can get better  1.continue CPAP 20 cm h20 2.cotninue spiriva as prescribed and albuterol as needed 3.will add Bevespi to regimen and see if this helps   Overall, prognosis is poor due to excessive morbid obesity. Patient strongly encouraged to watch diet He has refused to see Nutrition consult.  His SOB will continue as long as he is obese. Patient DOES NOT TOLERATE ANY TYPE OF STEROIDS as IT CAUSES CHEST PAIN  I strongly advise weight loss    The Patient requires high complexity decision making for assessment and support, frequent evaluation and titration of therapies, application of advanced monitoring technologies and extensive interpretation of multiple databases.   Patient satisfied with Plan of action and management. All questions answered  Lucie LeatherKurian David Colman Birdwell, M.D.  [facility]  Medical Director [facility] Medical Director [facility]

## 2015-11-16 NOTE — Patient Instructions (Signed)
Start Bevespi Continue [phone] Recommend Weight Loss  Chronic Obstructive Pulmonary Disease Chronic obstructive pulmonary disease (COPD) is a common lung condition in which airflow from the lungs is limited. COPD is a general term that can be used to describe many different lung problems that limit airflow, including both chronic bronchitis and emphysema. If you have COPD, your lung function will probably never return to normal, but there are measures you can take to improve lung function and make yourself feel better. CAUSES   Smoking (common).  Exposure to secondhand smoke.  Genetic problems.  Chronic inflammatory lung diseases or recurrent infections. SYMPTOMS  Shortness of breath, especially with physical activity.  Deep, persistent (chronic) cough with a large amount of thick mucus.  Wheezing.  Rapid breaths (tachypnea).  Gray or bluish discoloration (cyanosis) of the skin, especially in your fingers, toes, or lips.  Fatigue.  Weight loss.  Frequent infections or episodes when breathing symptoms become much worse (exacerbations).  Chest tightness. DIAGNOSIS Your health care provider will take a medical history and perform a physical examination to diagnose COPD. Additional tests for COPD may include:  Lung (pulmonary) function tests.  Chest X-ray.  CT scan.  Blood tests. TREATMENT  Treatment for COPD may include:  Inhaler and nebulizer medicines. These help manage the symptoms of COPD and make your breathing more comfortable.  Supplemental oxygen. Supplemental oxygen is only helpful if you have a low oxygen level in your blood.  Exercise and physical activity. These are beneficial for nearly all people with COPD.  Lung surgery or transplant.  Nutrition therapy to gain weight, if you are underweight.  Pulmonary rehabilitation. This may involve working with a team of health care providers and specialists, such as respiratory, occupational, and physical  therapists. HOME CARE INSTRUCTIONS  Take all medicines (inhaled or pills) as directed by your health care provider.  Avoid over-the-counter medicines or cough syrups that dry up your airway (such as antihistamines) and slow down the elimination of secretions unless instructed otherwise by your health care provider.  If you are a smoker, the most important thing that you can do is stop smoking. Continuing to smoke will cause further lung damage and breathing trouble. Ask your health care provider for help with quitting smoking. He or she can direct you to community resources or hospitals that provide support.  Avoid exposure to irritants such as smoke, chemicals, and fumes that aggravate your breathing.  Use oxygen therapy and pulmonary rehabilitation if directed by your health care provider. If you require home oxygen therapy, ask your health care provider whether you should purchase a pulse oximeter to measure your oxygen level at home.  Avoid contact with individuals who have a contagious illness.  Avoid extreme temperature and humidity changes.  Eat healthy foods. Eating smaller, more frequent meals and resting before meals may help you maintain your strength.  Stay active, but balance activity with periods of rest. Exercise and physical activity will help you maintain your ability to do things you want to do.  Preventing infection and hospitalization is very important when you have COPD. Make sure to receive all the vaccines your health care provider recommends, especially the pneumococcal and influenza vaccines. Ask your health care provider whether you need a pneumonia vaccine.  Learn and use relaxation techniques to manage stress.  Learn and use controlled breathing techniques as directed by your health care provider. Controlled breathing techniques include:  Pursed lip breathing. Start by breathing in (inhaling) through your nose for 1 second.  Then, purse your lips as if you were  going to whistle and breathe out (exhale) through the pursed lips for 2 seconds.  Diaphragmatic breathing. Start by putting one hand on your abdomen just above your waist. Inhale slowly through your nose. The hand on your abdomen should move out. Then purse your lips and exhale slowly. You should be able to feel the hand on your abdomen moving in as you exhale.  Learn and use controlled coughing to clear mucus from your lungs. Controlled coughing is a series of short, progressive coughs. The steps of controlled coughing are: 1. Lean your head slightly forward. 2. Breathe in deeply using diaphragmatic breathing. 3. Try to hold your breath for 3 seconds. 4. Keep your mouth slightly open while coughing twice. 5. Spit any mucus out into a tissue. 6. Rest and repeat the steps once or twice as needed. SEEK MEDICAL CARE IF:  You are coughing up more mucus than usual.  There is a change in the color or thickness of your mucus.  Your breathing is more labored than usual.  Your breathing is faster than usual. SEEK IMMEDIATE MEDICAL CARE IF:  You have shortness of breath while you are resting.  You have shortness of breath that prevents you from:  Being able to talk.  Performing your usual physical activities.  You have chest pain lasting longer than 5 minutes.  Your skin color is more cyanotic than usual.  You measure low oxygen saturations for longer than 5 minutes with a pulse oximeter. MAKE SURE YOU:  Understand these instructions.  Will watch your condition.  Will get help right away if you are not doing well or get worse.   This information is not intended to replace advice given to you by your health care provider. Make sure you discuss any questions you have with your health care provider.   Document Released: 10/17/2004 Document Revised: 01/28/2014 Document Reviewed: 09/03/2012 Elsevier Interactive Patient Education [employer].

## 2015-12-04 ENCOUNTER — Telehealth: Payer: Self-pay | Admitting: *Deleted

## 2015-12-04 NOTE — Telephone Encounter (Signed)
Initiated PA for [employer] thru [employer]. Key: [id number]  Sent for review:   Pt ID# [id number]

## 2015-12-05 NOTE — Telephone Encounter (Signed)
Still pending under CMM. 

## 2015-12-06 NOTE — Telephone Encounter (Signed)
Bradley Barnes [name] has been approved until 01/21/2016. Pharmacy informed.

## 2016-03-05 ENCOUNTER — Ambulatory Visit: Payer: Self-pay | Admitting: Urology

## 2016-03-19 ENCOUNTER — Telehealth: Payer: Self-pay | Admitting: Internal Medicine

## 2016-03-19 NOTE — Telephone Encounter (Signed)
Pt calling stating since he got home from hospital ([facility]) he's not been able to sleep well Think it machine is a bit too high He was in hospital for some heart issues he was having.  He lost about 25 pounds. His mouth is always dry. Think the pressure is a bit strong.  Please advise.

## 2016-03-19 NOTE — Telephone Encounter (Signed)
Please advise on message below. Pt setting was is 20cm H2O. I am requesting download.

## 2016-03-20 NOTE — Telephone Encounter (Signed)
Wait for download and advise to decrease to 16

## 2016-03-20 NOTE — Telephone Encounter (Signed)
Tried to call pt because in [place] last download was 2017. Line was busy. Will try to contact pt again to get a download.

## 2016-03-20 NOTE — Telephone Encounter (Signed)
Spoke with pt and he will bring his SD card by for me to download and then I will give to DK to advise.

## 2016-03-21 ENCOUNTER — Encounter: Payer: Self-pay | Admitting: Internal Medicine

## 2016-03-22 NOTE — Telephone Encounter (Signed)
Spouse brought in Chip to be read. Placed on [employer] desk ,

## 2016-03-22 NOTE — Telephone Encounter (Signed)
Download in your folder. Please advise on changes.

## 2016-04-08 ENCOUNTER — Ambulatory Visit (INDEPENDENT_AMBULATORY_CARE_PROVIDER_SITE_OTHER): Payer: Medicare HMO | Admitting: Internal Medicine

## 2016-04-08 ENCOUNTER — Encounter: Payer: Self-pay | Admitting: Internal Medicine

## 2016-04-08 VITALS — BP 126/76 | HR 94 | Ht 74.0 in | Wt 284.4 lb

## 2016-04-08 DIAGNOSIS — J449 Chronic obstructive pulmonary disease, unspecified: Secondary | ICD-10-CM

## 2016-04-08 MED ORDER — TIOTROPIUM BROMIDE MONOHYDRATE 2.5 MCG/ACT IN AERS: 2.0000 | INHALATION_SPRAY | Freq: Every day | RESPIRATORY_TRACT | 0 refills | Status: AC

## 2016-04-08 NOTE — Patient Instructions (Signed)
Will try SPiriva Respimat Follow up in 6 months

## 2016-04-08 NOTE — Progress Notes (Signed)
[facility] Consultation      Date: 04/08/2016,   MRN# [id number] Bradley Barnes. [birth year] Code Status:  Code Status History    This patient does not have a recorded code status. Please follow your organizational policy for patients in this situation.     Hosp day:@[email or web address] @ Referring MD: @ATDPROV @     PCP:      AdmissionWeight: 284 lb 6.4 oz (129 kg)                 CurrentWeight: 284 lb 6.4 oz (129 kg) Bradley Barnes. is a 76 y.o. 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  Acetaminophen 500 MG coapsule, Take by mouth., Disp: , Rfl:  .  albuterol (PROAIR HFA) 108 (90 Base) MCG/ACT inhaler, Inhale into the lungs., Disp: , Rfl:  .  Alum Hydroxide-Mag Carbonate (GAVISCON EXTRA RELIEF FORMULA PO), Take by mouth., Disp: , Rfl:  .  amLODipine (NORVASC) 10 MG tablet, Take by mouth., Disp: , Rfl:  .  aspirin EC 81 MG tablet, Take by mouth., Disp: , Rfl:  .  atorvastatin (LIPITOR) 40 MG tablet, Take by mouth., Disp: , Rfl:  .  Glycopyrrolate-Formoterol (BEVESPI AEROSPHERE) 9-4.8 MCG/ACT AERO, Inhale 2 puffs into the lungs 2 (two) times daily., Disp: 10.7 g, Rfl: 5 .  Investigational - Study Medication, Take by mouth. [facility] IDS, Disp: , Rfl:  .  Multiple Vitamin (MULTI-VITAMINS) TABS, Take by mouth., Disp: , Rfl:  .  nitroGLYCERIN (NITROSTAT) 0.4 MG SL tablet,  Place under the tongue., Disp: , Rfl:  .  Omega-3 1000 MG CAPS, Take by mouth., Disp: , Rfl:  .  ramipril (ALTACE) 10 MG capsule, Take by mouth., Disp: , Rfl:  .  ranitidine (ZANTAC) 300 MG tablet, Take by mouth., Disp: , Rfl:  .  sodium chloride (OCEAN) 0.65 % nasal spray, Place 1 spray into the nose as needed for congestion., Disp: , Rfl:  .  sodium chloride (V-R NASAL SPRAY SALINE) 0.65 % nasal spray, , Disp: , Rfl:  .  UNABLE TO FIND, Place 1 drop into both eyes daily. Med Name: Dextran 70, Disp: , Rfl:  .  isosorbide mononitrate (IMDUR) 60 MG 24 hr tablet, Take by mouth., Disp: , Rfl:  .  metoprolol (TOPROL-XL) 200 MG 24 hr tablet, Take by mouth., Disp: , Rfl:  .  tiotropium (SPIRIVA) 18 MCG inhalation capsule, Place into inhaler and inhale., Disp: , Rfl:     ALLERGIES   Penicillins; Promethazine; Erythromycin; Fluticasone furoate-vilanterol; Iron; Oxycodone; Proton pump inhibitors; Rosuvastatin; Tomato; Typhoid vaccines; Azithromycin; Sulfa antibiotics; and Testosterone     REVIEW OF SYSTEMS   Review of Systems  Constitutional: Positive for malaise/fatigue. Negative for chills, diaphoresis, fever and weight loss.  HENT: Negative for congestion and hearing loss.   Respiratory:  Positive for shortness of breath. Negative for cough, hemoptysis, sputum production and wheezing.   Cardiovascular: Positive for leg swelling. Negative for chest pain, palpitations and orthopnea.  Gastrointestinal: Negative for heartburn.  Genitourinary: Negative for dysuria.  Skin: Negative for rash.  Neurological: Negative for weakness and headaches.  Endo/Heme/Allergies: Does not bruise/bleed easily.  All other systems reviewed and are negative.   BP 126/76 (BP Location: Left Arm, Cuff Size: Normal)   Pulse 94   Ht 6\' 2"  (1.88 m)   Wt 284 lb 6.4 oz (129 kg)   SpO2 94%   BMI 36.51 kg/m     PHYSICAL EXAM  Physical Exam  Constitutional: He is oriented to person, place, and time. He appears  well-developed and well-nourished. No distress.  HENT:  Head: Normocephalic and atraumatic.  Mouth/Throat: No oropharyngeal exudate.  Eyes: EOM are normal. Pupils are equal, round, and reactive to light. No scleral icterus.  Neck: Normal range of motion. Neck supple.  Cardiovascular: Normal rate, regular rhythm and normal heart sounds.   No murmur heard. Pulmonary/Chest: No stridor. No respiratory distress. He has no wheezes.  Abdominal: Soft. Bowel sounds are normal.  Musculoskeletal: Normal range of motion. He exhibits edema.  Neurological: He is alert and oriented to person, place, and time. No cranial nerve deficit.  Skin: Skin is warm. He is not diaphoretic.  Psychiatric: He has a normal mood and affect.     IMAGING   CXR report reviewed no active disease noted  PFT 08/2015 Ratio 64% FEv1 55% DLCO 50% ONO/6MWT  Previous PFT 2015 Ratio 57% Fev1 1.9L 53%  PFT's   ASSESSMENT/PLAN   76 yo morbidly obese white male seen today for OSA on CPAP therapy and for Moderate COPD gold Stage B Overall, his resp status seems to be OK   1.continue autocpap 18cm h20 2.cotninue spiriva as prescribed and albuterol as needed(willtry respipmat version) and assess    Overall, prognosis is poor due to excessive morbid obesity. Patient strongly encouraged to watch diet He has refused to see Nutrition consult.  His SOB will continue as long as he is obese. Patient DOES NOT TOLERATE ANY TYPE OF STEROIDS as IT CAUSES CHEST PAIN  I strongly advise weight loss     Patient satisfied with Plan of action and management. All questions answered  Lucie LeatherKurian David Najma Bozarth, M.D.  [facility]  Medical Director [facility] Medical Director [facility]

## 2016-06-19 NOTE — Telephone Encounter (Signed)
FYI

## 2016-06-19 NOTE — Telephone Encounter (Signed)
Cancelled upcoming appts does not want to r/s .  [facility] never called him back about change (per patient ).

## 2016-06-25 ENCOUNTER — Encounter: Payer: Medicare HMO | Attending: Internal Medicine | Admitting: Respiratory Therapy

## 2016-06-25 VITALS — Ht 74.0 in | Wt 271.2 lb

## 2016-06-25 DIAGNOSIS — J449 Chronic obstructive pulmonary disease, unspecified: Secondary | ICD-10-CM | POA: Insufficient documentation

## 2016-06-25 NOTE — Progress Notes (Signed)
Pulmonary Individual Treatment Plan  Patient Details  Name: Bradley Barnes. MRN: [id number] Date of Birth: 1940/01/29 Referring Provider:     Pulmonary Rehab from 06/25/2016 in [facility] Cardiac and Pulmonary Rehab  Referring Provider  Daniel Nones MD      Initial Encounter Date:    Pulmonary Rehab from 06/25/2016 in [facility] Cardiac and Pulmonary Rehab  Date  06/25/16  Referring Provider  Daniel Nones MD      Visit Diagnosis: Chronic obstructive pulmonary disease, unspecified COPD type (HCC)  Patient's Home Medications on Admission:  Current Outpatient Prescriptions:    Acetaminophen 500 MG coapsule, Take by mouth., Disp: , Rfl:    albuterol (PROAIR HFA) 108 (90 Base) MCG/ACT inhaler, Inhale into the lungs., Disp: , Rfl:    Alum Hydroxide-Mag Carbonate (GAVISCON EXTRA RELIEF FORMULA PO), Take by mouth., Disp: , Rfl:    amLODipine (NORVASC) 10 MG tablet, Take by mouth., Disp: , Rfl:    aspirin EC 81 MG tablet, Take by mouth., Disp: , Rfl:    atorvastatin (LIPITOR) 40 MG tablet, Take by mouth., Disp: , Rfl:    furosemide (LASIX) 40 MG tablet, Take 40 mg by mouth., Disp: , Rfl:    Glycopyrrolate-Formoterol (BEVESPI AEROSPHERE) 9-4.8 MCG/ACT AERO, Inhale 2 puffs into the lungs 2 (two) times daily., Disp: 10.7 g, Rfl: 5   Investigational - Study Medication, Take by mouth. [facility] IDS, Disp: , Rfl:    isosorbide mononitrate (IMDUR) 60 MG 24 hr tablet, Take by mouth., Disp: , Rfl:    metoprolol (TOPROL-XL) 200 MG 24 hr tablet, Take by mouth., Disp: , Rfl:    mexiletine (MEXITIL) 200 MG capsule, Take 200 mg by mouth 3 (three) times daily., Disp: , Rfl:    Multiple Vitamin (MULTI-VITAMINS) TABS, Take by mouth., Disp: , Rfl:    nitroGLYCERIN (NITROSTAT) 0.4 MG SL tablet, Place under the tongue., Disp: , Rfl:    Omega-3 1000 MG CAPS, Take by mouth., Disp: , Rfl:    ramipril (ALTACE) 10 MG capsule, Take by mouth., Disp: , Rfl:    ranitidine (ZANTAC) 300 MG tablet, Take by  mouth., Disp: , Rfl:    sodium chloride (OCEAN) 0.65 % nasal spray, Place 1 spray into the nose as needed for congestion., Disp: , Rfl:    sodium chloride (V-R NASAL SPRAY SALINE) 0.65 % nasal spray, , Disp: , Rfl:    tamsulosin (FLOMAX) 0.4 MG CAPS capsule, Take 0.4 mg by mouth., Disp: , Rfl:    tiotropium (SPIRIVA) 18 MCG inhalation capsule, Place into inhaler and inhale., Disp: , Rfl:    Tiotropium Bromide Monohydrate (SPIRIVA RESPIMAT) 2.5 MCG/ACT AERS, Inhale 2 puffs into the lungs daily., Disp: 1 Inhaler, Rfl: 0   UNABLE TO FIND, Place 1 drop into both eyes daily. Med Name: Dextran 70, Disp: , Rfl:   Past Medical History: Past Medical History:  Diagnosis Date   Chronic kidney disease    COPD (chronic obstructive pulmonary disease) (HCC)    Hyperlipidemia    Hypertension    Myocardial infarction    Sleep apnea     Tobacco Use: History  Smoking Status   Former Smoker   Packs/day: 1.00   Years: 33.00   Types: Cigarettes   Quit date: 10/03/1991  Smokeless Tobacco   Never Used    Labs: Recent Review Flowsheet Data    There is no flowsheet data to display.       ADL UCSD:     Pulmonary Assessment Scores  Row Name 06/25/16 1519         ADL UCSD   ADL Phase Entry     SOB Score total 52     Rest 0     Walk 3     Stairs 4     Bath 2     Dress 2     Shop 4       mMRC Score   mMRC Score 2        Pulmonary Function Assessment:     Pulmonary Function Assessment - 06/25/16 1517      Pulmonary Function Tests   RV% 84 %   DLCO% 50 %     Initial Spirometry Results   FVC% 62 %   FEV1% 55 %   FEV1/FVC Ratio 64   Comments Test date 09/20/15     Post Bronchodilator Spirometry Results   FVC% 60 %   FEV1% 57 %   FEV1/FVC Ratio 68     Breath   Bilateral Breath Sounds Clear   Shortness of Breath Yes;Fear of Shortness of Breath;Limiting activity      Exercise Target Goals: Date: 06/25/16  Exercise Program Goal: Individual  exercise prescription set with THRR, safety & activity barriers. Participant demonstrates ability to understand and report RPE using BORG scale, to self-measure pulse accurately, and to acknowledge the importance of the exercise prescription.  Exercise Prescription Goal: Starting with aerobic activity 30 plus minutes a day, 3 days per week for initial exercise prescription. Provide home exercise prescription and guidelines that participant acknowledges understanding prior to discharge.  Activity Barriers & Risk Stratification:     Activity Barriers & Cardiac Risk Stratification - 06/25/16 1426      Activity Barriers & Cardiac Risk Stratification   Activity Barriers Arthritis;Chest Pain/Angina;Joint Problems;Deconditioning;Muscular Weakness;Right Hip Replacement;Left Knee Replacement;Back Problems;Assistive Device;Shortness of Breath;Decreased Ventricular Function;Balance Concerns  uses rolling walker for support and balance   Cardiac Risk Stratification High      6 Minute Walk:     6 Minute Walk    Row Name 06/25/16 1421         6 Minute Walk   Phase Initial     Distance 730 feet     Walk Time 6 minutes     # of Rest Breaks 0     MPH 1.38     METS 0     RPE 16     Perceived Dyspnea  3     VO2 Peak 3.34     Symptoms Yes (comment)     Comments short of breath, using rolling walker     Resting HR 57 bpm     Resting BP 134/64     Max Ex. HR 72 bpm     Max Ex. BP 144/74     2 Minute Post BP 126/70       Interval HR   Baseline HR 57     4 Minute HR 72     5 Minute HR 72     6 Minute HR 72     2 Minute Post HR 56     Interval Heart Rate? Yes       Interval Oxygen   Interval Oxygen? Yes     Baseline Oxygen Saturation % 98 %     Baseline Liters of Oxygen 0 L  Room Air     1 Minute Liters of Oxygen 0 L     2 Minute Liters of Oxygen 0 L  3 Minute Oxygen Saturation % 90 %     3 Minute Liters of Oxygen 0 L     4 Minute Oxygen Saturation % 95 %     4 Minute Liters of  Oxygen 0 L     5 Minute Oxygen Saturation % 99 %     5 Minute Liters of Oxygen 0 L     6 Minute Oxygen Saturation % 99 %     6 Minute Liters of Oxygen 0 L     2 Minute Post Oxygen Saturation % 100 %     2 Minute Post Liters of Oxygen 0 L       Oxygen Initial Assessment:     Oxygen Initial Assessment - 06/25/16 1522      Home Oxygen   Home Oxygen Device None      Oxygen Re-Evaluation:   Oxygen Discharge (Final Oxygen Re-Evaluation):   Initial Exercise Prescription:     Initial Exercise Prescription - 06/25/16 1400      Date of Initial Exercise RX and Referring Provider   Date 06/25/16   Referring Provider Daniel Nones MD     Treadmill   MPH 1.1   Grade 0   Minutes 15   METs 1.84     Bike   Level 0.5   Watts 10   Minutes 15   METs 1.5     T5 Nustep   Level 1   SPM 80   Minutes 15   METs 1.5     Track   Laps 15   Minutes 15   METs 1.7     Prescription Details   Frequency (times per week) 3   Duration Progress to 45 minutes of aerobic exercise without signs/symptoms of physical distress     Intensity   THRR 40-80% of Max Heartrate 91-127   Ratings of Perceived Exertion 11-13   Perceived Dyspnea 0-4     Progression   Progression Continue to progress workloads to maintain intensity without signs/symptoms of physical distress.     Resistance Training   Training Prescription Yes   Weight 3 lbs   Reps 10-15      Perform Capillary Blood Glucose checks as needed.  Exercise Prescription Changes:   Exercise Comments:   Exercise Goals and Review:     Exercise Goals    Row Name 06/25/16 1428             Exercise Goals   Increase Physical Activity Yes       Intervention Provide advice, education, support and counseling about physical activity/exercise needs.;Develop an individualized exercise prescription for aerobic and resistive training based on initial evaluation findings, risk stratification, comorbidities and participant's personal  goals.       Expected Outcomes Achievement of increased cardiorespiratory fitness and enhanced flexibility, muscular endurance and strength shown through measurements of functional capacity and personal statement of participant.       Increase Strength and Stamina Yes       Intervention Provide advice, education, support and counseling about physical activity/exercise needs.;Develop an individualized exercise prescription for aerobic and resistive training based on initial evaluation findings, risk stratification, comorbidities and participant's personal goals.       Expected Outcomes Achievement of increased cardiorespiratory fitness and enhanced flexibility, muscular endurance and strength shown through measurements of functional capacity and personal statement of participant.          Exercise Goals Re-Evaluation :   Discharge Exercise Prescription (Final Exercise Prescription Changes):  Nutrition:  Target Goals: Understanding of nutrition guidelines, daily intake of sodium 1500mg , cholesterol 200mg , calories 30% from fat and 7% or less from saturated fats, daily to have 5 or more servings of fruits and vegetables.  Biometrics:     Pre Biometrics - 06/25/16 1428      Pre Biometrics   Height 6\' 2"  (1.88 m)   Weight 271 lb 3.2 oz (123 kg)   Waist Circumference 52 inches   Hip Circumference 44 inches   Waist to Hip Ratio 1.18 %   BMI (Calculated) 34.9       Nutrition Therapy Plan and Nutrition Goals:   Nutrition Discharge: Rate Your Plate Scores:   Nutrition Goals Re-Evaluation:   Nutrition Goals Discharge (Final Nutrition Goals Re-Evaluation):   Psychosocial: Target Goals: Acknowledge presence or absence of significant depression and/or stress, maximize coping skills, provide positive support system. Participant is able to verbalize types and ability to use techniques and skills needed for reducing stress and depression.   Initial Review & Psychosocial Screening:      Initial Psych Review & Screening - 06/25/16 1533      Family Dynamics   Good Support System? Yes   Comments Mr Rho has good support from his wife and children. 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 and ICU hospitalization in June. Mr Clear is looking forward to supervised exercise and learning more about his COPD.     Barriers   Psychosocial barriers to participate in program There are no identifiable barriers or psychosocial needs.;The patient should benefit from training in stress management and relaxation.     Screening Interventions   Interventions Encouraged to exercise      Quality of Life Scores:     Quality of Life - 06/25/16 1540      Quality of Life Scores   Health/Function Pre 20.06 %   Socioeconomic Pre 19.83 %   Psych/Spiritual Pre 20.33 %   Family Pre 21 %   GLOBAL Pre 20.19 %      PHQ-9: Recent Review Flowsheet Data    Depression screen [facility] 2/9 06/25/2016 09/26/2015 08/07/2015   Decreased Interest 1 1 1    Down, Depressed, Hopeless 0 0 0   PHQ - 2 Score 1 1 1    Altered sleeping 1 2 1    Tired, decreased energy 2 1 1    Change in appetite 1 2 0   Feeling bad or failure about yourself  0 0 0   Trouble concentrating 1 1 1    Moving slowly or fidgety/restless 0 1 0   Suicidal thoughts 0 0 0   PHQ-9 Score 6 8 4    Difficult doing work/chores Very difficult Somewhat difficult Somewhat difficult     Interpretation of Total Score  Total Score Depression Severity:  1-4 = Minimal depression, 5-9 = Mild depression, 10-14 = Moderate depression, 15-19 = Moderately severe depression, 20-27 = Severe depression   Psychosocial Evaluation and Intervention:   Psychosocial Re-Evaluation:   Psychosocial Discharge (Final Psychosocial Re-Evaluation):   Education: Education Goals: Education classes will be provided on a weekly basis, covering required topics. Participant will state understanding/return demonstration of topics  presented.  Learning Barriers/Preferences:     Learning Barriers/Preferences - 06/25/16 1517      Learning Barriers/Preferences   Learning Barriers Exercise Concerns   Learning Preferences None      Education Topics: Initial Evaluation Education: - Verbal, written and demonstration of respiratory meds, RPE/PD scales, oximetry and breathing techniques.  Instruction on use of nebulizers and MDIs: cleaning and proper use, rinsing mouth with steroid doses and importance of monitoring MDI activations.   [facility]  Date  06/25/16  Educator  LB  Instruction Review Code  2- meets goals/outcomes      General Nutrition Guidelines/Fats and Fiber: -Group instruction provided by verbal, written material, models and posters to present the general guidelines for heart healthy nutrition. Gives an explanation and review of dietary fats and fiber.   [facility]  Date  10/10/15  Educator  CR  Instruction Review Code  R- Review/reinforce [Second Class]      Controlling Sodium/Reading Food Labels: -Group verbal and written material supporting the discussion of sodium use in heart healthy nutrition. Review and explanation with models, verbal and written materials for utilization of the food label.   [facility]  Date  08/22/15  Educator  Connye BurkittPam Ingram  Instruction Review Code  2- meets goals/outcomes      Exercise Physiology & Risk Factors: - Group verbal and written instruction with models to review the exercise physiology of the cardiovascular system and associated critical values. Details cardiovascular disease risk factors and the goals associated with each risk factor.   [facility]  Date  10/24/15  [facility]  [facility]  Instruction Review Code  2- meets goals/outcomes      Aerobic  Exercise & Resistance Training: - Gives group verbal and written discussion on the health impact of inactivity. On the components of aerobic and resistive training programs and the benefits of this training and how to safely progress through these programs.   [facility]  Date  10/26/15  [facility]  [facility]  Instruction Review Code  2- meets goals/outcomes      Flexibility, Balance, General Exercise Guidelines: - Provides group verbal and written instruction on the benefits of flexibility and balance training programs. Provides general exercise guidelines with specific guidelines to those with heart or lung disease. Demonstration and skill practice provided.   [facility]  Date  10/31/15  [facility]  [facility]  Instruction Review Code  2- meets goals/outcomes      Stress Management: - Provides group verbal and written instruction about the health risks of elevated stress, cause of high stress, and healthy ways to reduce stress.   [facility]  Date  09/07/15  Educator  CE  Instruction Review Code  2- meets goals/outcomes      Depression: - Provides group verbal and written instruction on the correlation between heart/lung disease and depressed mood, treatment options, and the stigmas associated with seeking treatment.   [facility]  Date  08/17/15  Educator  CE  Instruction Review Code  2- meets goals/outcomes      Exercise & Equipment Safety: - Individual verbal instruction and demonstration of equipment use and safety with use of the equipment.   Infection Prevention: - Provides verbal and written material to individual with discussion of infection control including proper hand washing and proper equipment cleaning during exercise session.   Falls Prevention: - Provides  verbal and written material to individual with discussion of falls prevention and safety.   Diabetes: -  Individual verbal and written instruction to review signs/symptoms of diabetes, desired ranges of glucose level fasting, after meals and with exercise. Advice that pre and post exercise glucose checks will be done for 3 sessions at entry of program.   Chronic Lung Diseases: - Group verbal and written instruction to review new updates, new respiratory medications, new advancements in procedures and treatments. Provide informative websites and "800" numbers of self-education.   Lung Procedures: - Group verbal and written instruction to describe testing methods done to diagnose lung disease. Review the outcome of test results. Describe the treatment choices: Pulmonary Function Tests, ABGs and oximetry.   Energy Conservation: - Provide group verbal and written instruction for methods to conserve energy, plan and organize activities. Instruct on pacing techniques, use of adaptive equipment and posture/positioning to relieve shortness of breath.   Triggers: - Group verbal and written instruction to review types of environmental controls: home humidity, furnaces, filters, dust mite/pet prevention, HEPA vacuums. To discuss weather changes, air quality and the benefits of nasal washing.   Exacerbations: - Group verbal and written instruction to provide: warning signs, infection symptoms, calling MD promptly, preventive modes, and value of vaccinations. Review: effective airway clearance, coughing and/or vibration techniques. Create an Sport and exercise psychologist.   Oxygen: - Individual and group verbal and written instruction on oxygen therapy. Includes supplement oxygen, available portable oxygen systems, continuous and intermittent flow rates, oxygen safety, concentrators, and Medicare reimbursement for oxygen.   Respiratory Medications: - Group verbal and written instruction to review medications for  lung disease. Drug class, frequency, complications, importance of spacers, rinsing mouth after steroid MDI's, and proper cleaning methods for nebulizers.   Pulmonary Rehab from 06/25/2016 in [facility] Cardiac and Pulmonary Rehab  Date  06/25/16  Educator  LB  Instruction Review Code  2- meets goals/outcomes      AED/CPR: - Group verbal and written instruction with the use of models to demonstrate the basic use of the AED with the basic ABC's of resuscitation.   Breathing Retraining: - Provides individuals verbal and written instruction on purpose, frequency, and proper technique of diaphragmatic breathing and pursed-lipped breathing. Applies individual practice skills.   Pulmonary Rehab from 06/25/2016 in [facility] Cardiac and Pulmonary Rehab  Date  06/25/16  Educator  LB  Instruction Review Code  2- meets goals/outcomes      Anatomy and Physiology of the Lungs: - Group verbal and written instruction with the use of models to provide basic lung anatomy and physiology related to function, structure and complications of lung disease.   Heart Failure: - Group verbal and written instruction on the basics of heart failure: signs/symptoms, treatments, explanation of ejection fraction, enlarged heart and cardiomyopathy.   Sleep Apnea: - Individual verbal and written instruction to review Obstructive Sleep Apnea. Review of risk factors, methods for diagnosing and types of masks and machines for OSA.   Pulmonary Rehab from 06/25/2016 in [facility] Cardiac and Pulmonary Rehab  Date  06/25/16  Educator  LB  Instruction Review Code  2- meets goals/outcomes      Anxiety: - Provides group, verbal and written instruction on the correlation between heart/lung disease and anxiety, treatment options, and management of anxiety.   Relaxation: - Provides group, verbal and written instruction about the benefits of relaxation for patients with heart/lung disease. Also provides patients with examples of relaxation  techniques.   Knowledge Questionnaire Score:     Knowledge Questionnaire Score - 06/25/16 1517      Knowledge Questionnaire Score   Pre  Score 10/10       Core Components/Risk Factors/Patient Goals at Admission:     Personal Goals and Risk Factors at Admission - 06/25/16 1522      Core Components/Risk Factors/Patient Goals on Admission    Weight Management Yes;Weight Loss   Intervention Weight Management: Develop a combined nutrition and exercise program designed to reach desired caloric intake, while maintaining appropriate intake of nutrient and fiber, sodium and fats, and appropriate energy expenditure required for the weight goal.;Weight Management: Provide education and appropriate resources to help participant work on and attain dietary goals.;Weight Management/Obesity: Establish reasonable short term and long term weight goals.;Obesity: Provide education and appropriate resources to help participant work on and attain dietary goals.   Admit Weight 271 lb 3.2 oz (123 kg)   Goal Weight: Short Term 266 lb (120.7 kg)   Goal Weight: Long Term 220 lb (99.8 kg)   Expected Outcomes Long Term: Adherence to nutrition and physical activity/exercise program aimed toward attainment of established weight goal;Short Term: Continue to assess and modify interventions until short term weight is achieved;Weight Maintenance: Understanding of the daily nutrition guidelines, which includes 25-35% calories from fat, 7% or less cal from saturated fats, less than 200mg  cholesterol, less than 1.5gm of sodium, & 5 or more servings of fruits and vegetables daily;Weight Loss: Understanding of general recommendations for a balanced deficit meal plan, which promotes 1-2 lb weight loss per week and includes a negative energy balance of [phone] kcal/d;Understanding recommendations for meals to include 15-35% energy as protein, 25-35% energy from fat, 35-60% energy from carbohydrates, less than 200mg  of dietary  cholesterol, 20-35 gm of total fiber daily;Understanding of distribution of calorie intake throughout the day with the consumption of 4-5 meals/snacks   Improve shortness of breath with ADL's Yes   Intervention Provide education, individualized exercise plan and daily activity instruction to help decrease symptoms of SOB with activities of daily living.   Expected Outcomes Short Term: Achieves a reduction of symptoms when performing activities of daily living.   Develop more efficient breathing techniques such as purse lipped breathing and diaphragmatic breathing; and practicing self-pacing with activity Yes   Intervention Provide education, demonstration and support about specific breathing techniuqes utilized for more efficient breathing. Include techniques such as pursed lipped breathing, diaphragmatic breathing and self-pacing activity.   Expected Outcomes Short Term: Participant will be able to demonstrate and use breathing techniques as needed throughout daily activities.   Increase knowledge of respiratory medications and ability to use respiratory devices properly  Yes  Anoro E and Albuterol MDI; spacer given   Intervention Provide education and demonstration as needed of appropriate use of medications, inhalers, and oxygen therapy.   Expected Outcomes Short Term: Achieves understanding of medications use. Understands that oxygen is a medication prescribed by physician. Demonstrates appropriate use of inhaler and oxygen therapy.   Heart Failure Yes   Intervention Provide a combined exercise and nutrition program that is supplemented with education, support and counseling about heart failure. Directed toward relieving symptoms such as shortness of breath, decreased exercise tolerance, and extremity edema.   Expected Outcomes Improve functional capacity of life;Short term: Attendance in program 2-3 days a week with increased exercise capacity. Reported lower sodium intake. Reported increased fruit  and vegetable intake. Reports medication compliance.;Short term: Daily weights obtained and reported for increase. Utilizing diuretic protocols set by physician.;Long term: Adoption of self-care skills and reduction of barriers for early signs and symptoms recognition and intervention leading to self-care maintenance.  Hypertension Yes   Intervention Provide education on lifestyle modifcations including regular physical activity/exercise, weight management, moderate sodium restriction and increased consumption of fresh fruit, vegetables, and low fat dairy, alcohol moderation, and smoking cessation.;Monitor prescription use compliance.   Expected Outcomes Short Term: Continued assessment and intervention until BP is < 140/1mm HG in hypertensive participants. < 130/71mm HG in hypertensive participants with diabetes, heart failure or chronic kidney disease.;Long Term: Maintenance of blood pressure at goal levels.   Lipids Yes   Intervention Provide education and support for participant on nutrition & aerobic/resistive exercise along with prescribed medications to achieve LDL 70mg , HDL >40mg .   Expected Outcomes Short Term: Participant states understanding of desired cholesterol values and is compliant with medications prescribed. Participant is following exercise prescription and nutrition guidelines.;Long Term: Cholesterol controlled with medications as prescribed, with individualized exercise RX and with personalized nutrition plan. Value goals: LDL < 70mg , HDL > 40 mg.      Core Components/Risk Factors/Patient Goals Review:    Core Components/Risk Factors/Patient Goals at Discharge (Final Review):    ITP Comments:   Comments: Mr Frye plans to start [facility] on 07/03/16 and attend 3 days/week.

## 2016-06-25 NOTE — Patient Instructions (Signed)
Patient Instructions  Patient Details  Name: Bradley ChamberlainWilliam C Barreiro Jr. MRN: [id number] Date of Birth: [birth year] Referring Provider:  Lynnea FerrierKlein, Bert J III, MD  Below are the personal goals you chose as well as exercise and nutrition goals. Our goal is to help you keep on track towards obtaining and maintaining your goals. We will be discussing your progress on these goals with you throughout the program.  Initial Exercise Prescription:     Initial Exercise Prescription - 06/25/16 1400      Date of Initial Exercise RX and Referring Provider   Date 06/25/16   Referring Provider Daniel NonesKlein, Bert MD     Treadmill   MPH 1.1   Grade 0   Minutes 15   METs 1.84     Bike   Level 0.5   Watts 10   Minutes 15   METs 1.5     T5 Nustep   Level 1   SPM 80   Minutes 15   METs 1.5     Track   Laps 15   Minutes 15   METs 1.7     Prescription Details   Frequency (times per week) 3   Duration Progress to 45 minutes of aerobic exercise without signs/symptoms of physical distress     Intensity   THRR 40-80% of Max Heartrate 91-127   Ratings of Perceived Exertion 11-13   Perceived Dyspnea 0-4     Progression   Progression Continue to progress workloads to maintain intensity without signs/symptoms of physical distress.     Resistance Training   Training Prescription Yes   Weight 3 lbs   Reps 10-15      Exercise Goals: Frequency: Be able to perform aerobic exercise three times per week working toward 3-5 days per week.  Intensity: Work with a perceived exertion of 11 (fairly light) - 15 (hard) as tolerated. Follow your new exercise prescription and watch for changes in prescription as you progress with the program. Changes will be reviewed with you when they are made.  Duration: You should be able to do 30 minutes of continuous aerobic exercise in addition to a 5 minute warm-up and a 5 minute cool-down routine.  Nutrition Goals: Your personal nutrition goals will be established when  you do your nutrition analysis with the dietician.  The following are nutrition guidelines to follow: Cholesterol < 200mg /day Sodium < 1500mg /day Fiber: Men over 50 yrs - 30 grams per day  Personal Goals:     Personal Goals and Risk Factors at Admission - 06/25/16 1522      Core Components/Risk Factors/Patient Goals on Admission    Weight Management Yes;Weight Loss   Intervention Weight Management: Develop a combined nutrition and exercise program designed to reach desired caloric intake, while maintaining appropriate intake of nutrient and fiber, sodium and fats, and appropriate energy expenditure required for the weight goal.;Weight Management: Provide education and appropriate resources to help participant work on and attain dietary goals.;Weight Management/Obesity: Establish reasonable short term and long term weight goals.;Obesity: Provide education and appropriate resources to help participant work on and attain dietary goals.   Admit Weight 271 lb 3.2 oz (123 kg)   Goal Weight: Short Term 266 lb (120.7 kg)   Goal Weight: Long Term 220 lb (99.8 kg)   Expected Outcomes Long Term: Adherence to nutrition and physical activity/exercise program aimed toward attainment of established weight goal;Short Term: Continue to assess and modify interventions until short term weight is achieved;Weight Maintenance: Understanding of the daily nutrition  guidelines, which includes 25-35% calories from fat, 7% or less cal from saturated fats, less than 200mg  cholesterol, less than 1.5gm of sodium, & 5 or more servings of fruits and vegetables daily;Weight Loss: Understanding of general recommendations for a balanced deficit meal plan, which promotes 1-2 lb weight loss per week and includes a negative energy balance of [id number] kcal/d;Understanding recommendations for meals to include 15-35% energy as protein, 25-35% energy from fat, 35-60% energy from carbohydrates, less than 200mg  of dietary cholesterol, 20-35  gm of total fiber daily;Understanding of distribution of calorie intake throughout the day with the consumption of 4-5 meals/snacks   Improve shortness of breath with ADL's Yes   Intervention Provide education, individualized exercise plan and daily activity instruction to help decrease symptoms of SOB with activities of daily living.   Expected Outcomes Short Term: Achieves a reduction of symptoms when performing activities of daily living.   Develop more efficient breathing techniques such as purse lipped breathing and diaphragmatic breathing; and practicing self-pacing with activity Yes   Intervention Provide education, demonstration and support about specific breathing techniuqes utilized for more efficient breathing. Include techniques such as pursed lipped breathing, diaphragmatic breathing and self-pacing activity.   Expected Outcomes Short Term: Participant will be able to demonstrate and use breathing techniques as needed throughout daily activities.   Increase knowledge of respiratory medications and ability to use respiratory devices properly  Yes  Anoro E and Albuterol MDI; spacer given   Intervention Provide education and demonstration as needed of appropriate use of medications, inhalers, and oxygen therapy.   Expected Outcomes Short Term: Achieves understanding of medications use. Understands that oxygen is a medication prescribed by physician. Demonstrates appropriate use of inhaler and oxygen therapy.   Heart Failure Yes   Intervention Provide a combined exercise and nutrition program that is supplemented with education, support and counseling about heart failure. Directed toward relieving symptoms such as shortness of breath, decreased exercise tolerance, and extremity edema.   Expected Outcomes Improve functional capacity of life;Short term: Attendance in program 2-3 days a week with increased exercise capacity. Reported lower sodium intake. Reported increased fruit and vegetable  intake. Reports medication compliance.;Short term: Daily weights obtained and reported for increase. Utilizing diuretic protocols set by physician.;Long term: Adoption of self-care skills and reduction of barriers for early signs and symptoms recognition and intervention leading to self-care maintenance.   Hypertension Yes   Intervention Provide education on lifestyle modifcations including regular physical activity/exercise, weight management, moderate sodium restriction and increased consumption of fresh fruit, vegetables, and low fat dairy, alcohol moderation, and smoking cessation.;Monitor prescription use compliance.   Expected Outcomes Short Term: Continued assessment and intervention until BP is < 140/18mm HG in hypertensive participants. < 130/22mm HG in hypertensive participants with diabetes, heart failure or chronic kidney disease.;Long Term: Maintenance of blood pressure at goal levels.   Lipids Yes   Intervention Provide education and support for participant on nutrition & aerobic/resistive exercise along with prescribed medications to achieve LDL 70mg , HDL >40mg .   Expected Outcomes Short Term: Participant states understanding of desired cholesterol values and is compliant with medications prescribed. Participant is following exercise prescription and nutrition guidelines.;Long Term: Cholesterol controlled with medications as prescribed, with individualized exercise RX and with personalized nutrition plan. Value goals: LDL < 70mg , HDL > 40 mg.      Tobacco Use Initial Evaluation: History  Smoking Status   Former Smoker   Packs/day: 1.00   Years: 33.00   Types: Cigarettes   Quit date:  10/03/1991  Smokeless Tobacco   Never Used    Copy of goals given to participant.

## 2016-07-03 DIAGNOSIS — J449 Chronic obstructive pulmonary disease, unspecified: Secondary | ICD-10-CM | POA: Diagnosis not present

## 2016-07-03 DIAGNOSIS — I2089 Other forms of angina pectoris: Secondary | ICD-10-CM

## 2016-07-03 DIAGNOSIS — I208 Other forms of angina pectoris: Secondary | ICD-10-CM

## 2016-07-03 NOTE — Progress Notes (Signed)
Daily Session Note  Patient Details  Name: Bradley Barnes. MRN: [id number] Date of Birth: 1940-03-18 Referring Provider:     Pulmonary Rehab from 06/25/2016 in [facility] Cardiac and Pulmonary Rehab  Referring Provider  Ramonita Lab MD      Encounter Date: 07/03/2016  Check In:     Session Check In - 07/03/16 1245      Check-In   Location [facility]-Cardiac & Pulmonary Rehab   Staff Present Carson Myrtle, BS, RRT, Respiratory Lennie Hummer, MA, ACSM RCEP, Exercise Physiologist;Amanda Oletta Darter, BA, ACSM CEP, Exercise Physiologist   Supervising physician immediately available to respond to emergencies [facility] immediately available ER MD   Physician(s) Kerman Passey and Mariea Clonts   Medication changes reported     No   Fall or balance concerns reported    No   Warm-up and Cool-down Performed as group-led instruction   Resistance Training Performed Yes   VAD Patient? No     Pain Assessment   Currently in Pain? No/denies         History  Smoking Status  . Former Smoker  . Packs/day: 1.00  . Years: 33.00  . Types: Cigarettes  . Quit date: 10/03/1991  Smokeless Tobacco  . Never Used    Goals Met:  Proper associated with RPD/PD & O2 Sat Independence with exercise equipment Exercise tolerated well Strength training completed today  Goals Unmet:  Not Applicable  Comments: First full day of exercise!  Patient was oriented to gym and equipment including functions, settings, policies, and procedures.  Patient's individual exercise prescription and treatment plan were reviewed.  All starting workloads were established based on the results of the 6 minute walk test done at initial orientation visit.  The plan for exercise progression was also introduced and progression will be customized based on patient's performance and goals.    Dr. Emily Filbert is Medical Director for [place] and [facility].

## 2016-07-09 ENCOUNTER — Ambulatory Visit: Payer: Medicare HMO | Admitting: Internal Medicine

## 2016-07-12 DIAGNOSIS — J449 Chronic obstructive pulmonary disease, unspecified: Secondary | ICD-10-CM | POA: Diagnosis not present

## 2016-07-12 NOTE — Progress Notes (Signed)
Daily Session Note  Patient Details  Name: Bradley Barnes. MRN: [id number] Date of Birth: 06-07-1940 Referring Provider:     Pulmonary Rehab from 06/25/2016 in [facility] Cardiac and Pulmonary Rehab  Referring Provider  Ramonita Lab MD      Encounter Date: 07/12/2016  Check In:     Session Check In - 07/12/16 1153      Check-In   Location [facility]-Cardiac & Pulmonary Rehab   Staff Present Alberteen Sam, MA, ACSM RCEP, Exercise Physiologist;Amanda Oletta Darter, BA, ACSM CEP, Exercise Physiologist;Mary Kellie Shropshire, RN, BSN, Lauretta Grill RCP,RRT,BSRT   Supervising physician immediately available to respond to emergencies [facility] immediately available ER MD   Physician(s) Dr. Jacqualine Code and Reita Cliche   Medication changes reported     No   Fall or balance concerns reported    No   Warm-up and Cool-down Performed as group-led instruction   Resistance Training Performed Yes   VAD Patient? No     Pain Assessment   Currently in Pain? No/denies   Multiple Pain Sites No         History  Smoking Status  . Former Smoker  . Packs/day: 1.00  . Years: 33.00  . Types: Cigarettes  . Quit date: 10/03/1991  Smokeless Tobacco  . Never Used    Goals Met:  Proper associated with RPD/PD & O2 Sat Independence with exercise equipment Exercise tolerated well Strength training completed today  Goals Unmet:  Not Applicable  Comments: Pt able to follow exercise prescription today without complaint.  Will continue to monitor for progression.   Dr. Emily Filbert is Medical Director for [place] and [facility].

## 2016-07-15 ENCOUNTER — Encounter: Payer: Medicare HMO | Admitting: *Deleted

## 2016-07-15 DIAGNOSIS — J449 Chronic obstructive pulmonary disease, unspecified: Secondary | ICD-10-CM

## 2016-07-15 DIAGNOSIS — I208 Other forms of angina pectoris: Secondary | ICD-10-CM

## 2016-07-15 NOTE — Progress Notes (Signed)
Daily Session Note  Patient Details  Name: Bradley Barnes. MRN: [id number] Date of Birth: Jul 10, 1940 Referring Provider:     Pulmonary Rehab from 06/25/2016 in [facility] Cardiac and Pulmonary Rehab  Referring Provider  Bradley Lab MD      Encounter Date: 07/15/2016  Check In:     Session Check In - 07/15/16 1045      Check-In   Location [facility]-Cardiac & Pulmonary Rehab   Staff Present Earlean Shawl, BS, ACSM CEP, Exercise Physiologist;Laureen Owens Shark, BS, RRT, Respiratory Therapist;Carroll Enterkin, RN, BSN   Supervising physician immediately available to respond to emergencies [facility] immediately available ER MD   Physician(s) Dr. Corky Downs and Dr. Mable Paris   Medication changes reported     No   Fall or balance concerns reported    No   Warm-up and Cool-down Performed as group-led instruction   Resistance Training Performed Yes   VAD Patient? No     Pain Assessment   Multiple Pain Sites No         History  Smoking Status  . Former Smoker  . Packs/day: 1.00  . Years: 33.00  . Types: Cigarettes  . Quit date: 10/03/1991  Smokeless Tobacco  . Never Used    Goals Met:  Proper associated with RPD/PD & O2 Sat Independence with exercise equipment Exercise tolerated well Personal goals reviewed Strength training completed today  Goals Unmet:  Not Applicable  Comments: Pt able to follow exercise prescription today without complaint.  Will continue to monitor for progression.    Dr. Emily Filbert is Medical Director for [place] and [facility].

## 2016-07-17 ENCOUNTER — Encounter: Payer: Medicare HMO | Admitting: *Deleted

## 2016-07-17 DIAGNOSIS — J449 Chronic obstructive pulmonary disease, unspecified: Secondary | ICD-10-CM

## 2016-07-17 NOTE — Progress Notes (Signed)
Daily Session Note  Patient Details  Name: Bradley Barnes. MRN: [id number] Date of Birth: 20-Jan-1941 Referring Provider:     Pulmonary Rehab from 06/25/2016 in [facility] Cardiac and Pulmonary Rehab  Referring Provider  Ramonita Lab MD      Encounter Date: 07/17/2016  Check In:     Session Check In - 07/17/16 1124      Check-In   Location [facility]-Cardiac & Pulmonary Rehab   Staff Present Alberteen Sam, MA, ACSM RCEP, Exercise Physiologist;Laureen Owens Shark, BS, RRT, Respiratory Linus Orn, RN BSN   Supervising physician immediately available to respond to emergencies [facility] immediately available ER MD   Physician(s) Drs. Jimmye Norman and Quentin Cornwall   Medication changes reported     No   Fall or balance concerns reported    No   Warm-up and Cool-down Performed as group-led Location manager Performed Yes   VAD Patient? No     Pain Assessment   Currently in Pain? No/denies   Multiple Pain Sites No         History  Smoking Status  . Former Smoker  . Packs/day: 1.00  . Years: 33.00  . Types: Cigarettes  . Quit date: 10/03/1991  Smokeless Tobacco  . Never Used    Goals Met:  Proper associated with RPD/PD & O2 Sat Independence with exercise equipment Using PLB without cueing & demonstrates good technique Exercise tolerated well Strength training completed today  Goals Unmet:  Not Applicable  Comments: Pt able to follow exercise prescription today without complaint.  Will continue to monitor for progression.    Dr. Emily Filbert is Medical Director for [place] and [facility].

## 2016-07-22 ENCOUNTER — Encounter: Payer: Medicare HMO | Attending: Internal Medicine | Admitting: *Deleted

## 2016-07-22 DIAGNOSIS — J449 Chronic obstructive pulmonary disease, unspecified: Secondary | ICD-10-CM | POA: Diagnosis not present

## 2016-07-22 DIAGNOSIS — I208 Other forms of angina pectoris: Secondary | ICD-10-CM

## 2016-07-22 NOTE — Progress Notes (Signed)
Daily Session Note  Patient Details  Name: Bradley Barnes. MRN: [id number] Date of Birth: 01-26-1940 Referring Provider:     Pulmonary Rehab from 06/25/2016 in [facility] Cardiac and Pulmonary Rehab  Referring Provider  Ramonita Lab MD      Encounter Date: 07/22/2016  Check In:     Session Check In - 07/22/16 1018      Check-In   Location [facility]-Cardiac & Pulmonary Rehab   Staff Present Carson Myrtle, BS, RRT, Respiratory Bertis Ruddy, BS, ACSM CEP, Exercise Physiologist;Joseph Foy [place], BA, ACSM CEP, Exercise Physiologist   Supervising physician immediately available to respond to emergencies [facility] immediately available ER MD   Physician(s) Dr. Mable Paris and Dr. Corky Downs   Medication changes reported     No   Fall or balance concerns reported    No   Warm-up and Cool-down Performed as group-led instruction   Resistance Training Performed Yes   VAD Patient? No     Pain Assessment   Currently in Pain? No/denies   Multiple Pain Sites No         History  Smoking Status  . Former Smoker  . Packs/day: 1.00  . Years: 33.00  . Types: Cigarettes  . Quit date: 10/03/1991  Smokeless Tobacco  . Never Used    Goals Met:  Proper associated with RPD/PD & O2 Sat Independence with exercise equipment Exercise tolerated well Strength training completed today  Goals Unmet:  Not Applicable  Comments: Pt able to follow exercise prescription today without complaint.  Will continue to monitor for progression.    Dr. Emily Filbert is Medical Director for [facility] and [facility].

## 2016-07-22 NOTE — Progress Notes (Signed)
Pulmonary Individual Treatment Plan  Patient Details  Name: Bradley Barnes. MRN: [id number] Date of Birth: [birth year] Referring Provider:     Pulmonary Rehab from 06/25/2016 in [facility] Cardiac and Pulmonary Rehab  Referring Provider  Daniel Nones MD      Initial Encounter Date:    Pulmonary Rehab from 06/25/2016 in [facility] Cardiac and Pulmonary Rehab  Date  06/25/16  Referring Provider  Daniel Nones MD      Visit Diagnosis: Chronic obstructive pulmonary disease, unspecified COPD type (HCC)  Patient's Home Medications on Admission:  Current Outpatient Prescriptions:  .  Acetaminophen 500 MG coapsule, Take by mouth., Disp: , Rfl:  .  albuterol (PROAIR HFA) 108 (90 Base) MCG/ACT inhaler, Inhale into the lungs., Disp: , Rfl:  .  Alum Hydroxide-Mag Carbonate (GAVISCON EXTRA RELIEF FORMULA PO), Take by mouth., Disp: , Rfl:  .  amLODipine (NORVASC) 10 MG tablet, Take by mouth., Disp: , Rfl:  .  aspirin EC 81 MG tablet, Take by mouth., Disp: , Rfl:  .  atorvastatin (LIPITOR) 40 MG tablet, Take by mouth., Disp: , Rfl:  .  furosemide (LASIX) 40 MG tablet, Take 40 mg by mouth., Disp: , Rfl:  .  Glycopyrrolate-Formoterol (BEVESPI AEROSPHERE) 9-4.8 MCG/ACT AERO, Inhale 2 puffs into the lungs 2 (two) times daily., Disp: 10.7 g, Rfl: 5 .  Investigational - Study Medication, Take by mouth. [facility] IDS, Disp: , Rfl:  .  isosorbide mononitrate (IMDUR) 60 MG 24 hr tablet, Take by mouth., Disp: , Rfl:  .  metoprolol (TOPROL-XL) 200 MG 24 hr tablet, Take by mouth., Disp: , Rfl:  .  mexiletine (MEXITIL) 200 MG capsule, Take 200 mg by mouth 3 (three) times daily., Disp: , Rfl:  .  Multiple Vitamin (MULTI-VITAMINS) TABS, Take by mouth., Disp: , Rfl:  .  nitroGLYCERIN (NITROSTAT) 0.4 MG SL tablet, Place under the tongue., Disp: , Rfl:  .  Omega-3 1000 MG CAPS, Take by mouth., Disp: , Rfl:  .  ramipril (ALTACE) 10 MG capsule, Take by mouth., Disp: , Rfl:  .  ranitidine (ZANTAC) 300 MG tablet, Take by  mouth., Disp: , Rfl:  .  sodium chloride (OCEAN) 0.65 % nasal spray, Place 1 spray into the nose as needed for congestion., Disp: , Rfl:  .  sodium chloride (V-R NASAL SPRAY SALINE) 0.65 % nasal spray, , Disp: , Rfl:  .  tamsulosin (FLOMAX) 0.4 MG CAPS capsule, Take 0.4 mg by mouth., Disp: , Rfl:  .  tiotropium (SPIRIVA) 18 MCG inhalation capsule, Place into inhaler and inhale., Disp: , Rfl:  .  Tiotropium Bromide Monohydrate (SPIRIVA RESPIMAT) 2.5 MCG/ACT AERS, Inhale 2 puffs into the lungs daily., Disp: 1 Inhaler, Rfl: 0 .  UNABLE TO FIND, Place 1 drop into both eyes daily. Med Name: Dextran 70, Disp: , Rfl:   Past Medical History: Past Medical History:  Diagnosis Date  . Chronic kidney disease   . COPD (chronic obstructive pulmonary disease) (HCC)   . Hyperlipidemia   . Hypertension   . Myocardial infarction   . Sleep apnea     Tobacco Use: History  Smoking Status  . Former Smoker  . Packs/day: 1.00  . Years: 33.00  . Types: Cigarettes  . Quit date: 10/03/1991  Smokeless Tobacco  . Never Used    Labs: Recent Review Flowsheet Data    There is no flowsheet data to display.       ADL UCSD:     Pulmonary Assessment Scores  Row Name 06/25/16 1519         ADL UCSD   ADL Phase Entry     SOB Score total 52     Rest 0     Walk 3     Stairs 4     Bath 2     Dress 2     Shop 4       mMRC Score   mMRC Score 2        Pulmonary Function Assessment:     Pulmonary Function Assessment - 06/25/16 1517      Pulmonary Function Tests   RV% 84 %   DLCO% 50 %     Initial Spirometry Results   FVC% 62 %   FEV1% 55 %   FEV1/FVC Ratio 64   Comments Test date 09/20/15     Post Bronchodilator Spirometry Results   FVC% 60 %   FEV1% 57 %   FEV1/FVC Ratio 68     Breath   Bilateral Breath Sounds Clear   Shortness of Breath Yes;Fear of Shortness of Breath;Limiting activity      Exercise Target Goals:    Exercise Program Goal: Individual exercise  prescription set with THRR, safety & activity barriers. Participant demonstrates ability to understand and report RPE using BORG scale, to self-measure pulse accurately, and to acknowledge the importance of the exercise prescription.  Exercise Prescription Goal: Starting with aerobic activity 30 plus minutes a day, 3 days per week for initial exercise prescription. Provide home exercise prescription and guidelines that participant acknowledges understanding prior to discharge.  Activity Barriers & Risk Stratification:     Activity Barriers & Cardiac Risk Stratification - 06/25/16 1426      Activity Barriers & Cardiac Risk Stratification   Activity Barriers Arthritis;Chest Pain/Angina;Joint Problems;Deconditioning;Muscular Weakness;Right Hip Replacement;Left Knee Replacement;Back Problems;Assistive Device;Shortness of Breath;Decreased Ventricular Function;Balance Concerns  uses rolling walker for support and balance   Cardiac Risk Stratification High      6 Minute Walk:     6 Minute Walk    Row Name 06/25/16 1421         6 Minute Walk   Phase Initial     Distance 730 feet     Walk Time 6 minutes     # of Rest Breaks 0     MPH 1.38     METS 0     RPE 16     Perceived Dyspnea  3     VO2 Peak 3.34     Symptoms Yes (comment)     Comments short of breath, using rolling walker     Resting HR 57 bpm     Resting BP 134/64     Max Ex. HR 72 bpm     Max Ex. BP 144/74     2 Minute Post BP 126/70       Interval HR   Baseline HR 57     4 Minute HR 72     5 Minute HR 72     6 Minute HR 72     2 Minute Post HR 56     Interval Heart Rate? Yes       Interval Oxygen   Interval Oxygen? Yes     Baseline Oxygen Saturation % 98 %     Baseline Liters of Oxygen 0 L  Room Air     1 Minute Liters of Oxygen 0 L     2 Minute Liters of Oxygen 0 L  3 Minute Oxygen Saturation % 90 %     3 Minute Liters of Oxygen 0 L     4 Minute Oxygen Saturation % 95 %     4 Minute Liters of Oxygen 0  L     5 Minute Oxygen Saturation % 99 %     5 Minute Liters of Oxygen 0 L     6 Minute Oxygen Saturation % 99 %     6 Minute Liters of Oxygen 0 L     2 Minute Post Oxygen Saturation % 100 %     2 Minute Post Liters of Oxygen 0 L       Oxygen Initial Assessment:     Oxygen Initial Assessment - 06/25/16 1522      Home Oxygen   Home Oxygen Device None      Oxygen Re-Evaluation:     Oxygen Re-Evaluation    Row Name 07/15/16 1419             Program Oxygen Prescription   Program Oxygen Prescription None         Home Oxygen   Home Oxygen Device None          Oxygen Discharge (Final Oxygen Re-Evaluation):     Oxygen Re-Evaluation - 07/15/16 1419      Program Oxygen Prescription   Program Oxygen Prescription None     Home Oxygen   Home Oxygen Device None      Initial Exercise Prescription:     Initial Exercise Prescription - 06/25/16 1400      Date of Initial Exercise RX and Referring Provider   Date 06/25/16   Referring Provider Daniel Nones MD     Treadmill   MPH 1.1   Grade 0   Minutes 15   METs 1.84     Bike   Level 0.5   Watts 10   Minutes 15   METs 1.5     T5 Nustep   Level 1   SPM 80   Minutes 15   METs 1.5     Track   Laps 15   Minutes 15   METs 1.7     Prescription Details   Frequency (times per week) 3   Duration Progress to 45 minutes of aerobic exercise without signs/symptoms of physical distress     Intensity   THRR 40-80% of Max Heartrate 91-127   Ratings of Perceived Exertion 11-13   Perceived Dyspnea 0-4     Progression   Progression Continue to progress workloads to maintain intensity without signs/symptoms of physical distress.     Resistance Training   Training Prescription Yes   Weight 3 lbs   Reps 10-15      Perform Capillary Blood Glucose checks as needed.  Exercise Prescription Changes:     Exercise Prescription Changes    Row Name 07/03/16 1200             Response to Exercise   Blood  Pressure (Admit) 102/64       Blood Pressure (Exercise) 146/64       Blood Pressure (Exit) 104/58       Heart Rate (Admit) 56 bpm       Heart Rate (Exercise) 64 bpm       Heart Rate (Exit) 54 bpm       Oxygen Saturation (Admit) 100 %       Oxygen Saturation (Exercise) 99 %       Oxygen Saturation (Exit) 100 %  Rating of Perceived Exertion (Exercise) 14       Perceived Dyspnea (Exercise) 2       Duration Progress to 45 minutes of aerobic exercise without signs/symptoms of physical distress       Intensity THRR unchanged         Progression   Progression Continue to progress workloads to maintain intensity without signs/symptoms of physical distress.         Resistance Training   Training Prescription Yes       Weight 4       Reps 10-15         Bike   Level 0.5       Watts 10       Minutes 15         T5 Nustep   Level 1       SPM 103       Minutes 15       METs 2.1          Exercise Comments:     Exercise Comments    Row Name 07/03/16 1247           Exercise Comments First full day of exercise!  Patient was oriented to gym and equipment including functions, settings, policies, and procedures.  Patient's individual exercise prescription and treatment plan were reviewed.  All starting workloads were established based on the results of the 6 minute walk test done at initial orientation visit.  The plan for exercise progression was also introduced and progression will be customized based on patient's performance and goals.          Exercise Goals and Review:     Exercise Goals    Row Name 06/25/16 1428             Exercise Goals   Increase Physical Activity Yes       Intervention Provide advice, education, support and counseling about physical activity/exercise needs.;Develop an individualized exercise prescription for aerobic and resistive training based on initial evaluation findings, risk stratification, comorbidities and participant's personal goals.        Expected Outcomes Achievement of increased cardiorespiratory fitness and enhanced flexibility, muscular endurance and strength shown through measurements of functional capacity and personal statement of participant.       Increase Strength and Stamina Yes       Intervention Provide advice, education, support and counseling about physical activity/exercise needs.;Develop an individualized exercise prescription for aerobic and resistive training based on initial evaluation findings, risk stratification, comorbidities and participant's personal goals.       Expected Outcomes Achievement of increased cardiorespiratory fitness and enhanced flexibility, muscular endurance and strength shown through measurements of functional capacity and personal statement of participant.          Exercise Goals Re-Evaluation :   Discharge Exercise Prescription (Final Exercise Prescription Changes):     Exercise Prescription Changes - 07/03/16 1200      Response to Exercise   Blood Pressure (Admit) 102/64   Blood Pressure (Exercise) 146/64   Blood Pressure (Exit) 104/58   Heart Rate (Admit) 56 bpm   Heart Rate (Exercise) 64 bpm   Heart Rate (Exit) 54 bpm   Oxygen Saturation (Admit) 100 %   Oxygen Saturation (Exercise) 99 %   Oxygen Saturation (Exit) 100 %   Rating of Perceived Exertion (Exercise) 14   Perceived Dyspnea (Exercise) 2   Duration Progress to 45 minutes of aerobic exercise without signs/symptoms of physical distress   Intensity  THRR unchanged     Progression   Progression Continue to progress workloads to maintain intensity without signs/symptoms of physical distress.     Resistance Training   Training Prescription Yes   Weight 4   Reps 10-15     Bike   Level 0.5   Watts 10   Minutes 15     T5 Nustep   Level 1   SPM 103   Minutes 15   METs 2.1      Nutrition:  Target Goals: Understanding of nutrition guidelines, daily intake of sodium 1500mg , cholesterol 200mg , calories  30% from fat and 7% or less from saturated fats, daily to have 5 or more servings of fruits and vegetables.  Biometrics:     Pre Biometrics - 06/25/16 1428      Pre Biometrics   Height 6\' 2"  (1.88 m)   Weight 271 lb 3.2 oz (123 kg)   Waist Circumference 52 inches   Hip Circumference 44 inches   Waist to Hip Ratio 1.18 %   BMI (Calculated) 34.9       Nutrition Therapy Plan and Nutrition Goals:   Nutrition Discharge: Rate Your Plate Scores:   Nutrition Goals Re-Evaluation:   Nutrition Goals Discharge (Final Nutrition Goals Re-Evaluation):   Psychosocial: Target Goals: Acknowledge presence or absence of significant depression and/or stress, maximize coping skills, provide positive support system. Participant is able to verbalize types and ability to use techniques and skills needed for reducing stress and depression.   Initial Review & Psychosocial Screening:     Initial Psych Review & Screening - 06/25/16 1533      Family Dynamics   Good Support System? Yes   Comments Mr Banh has good support from his wife and children. 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 and ICU hospitalization in June. Mr Cueto is looking forward to supervised exercise and learning more about his COPD.     Barriers   Psychosocial barriers to participate in program There are no identifiable barriers or psychosocial needs.;The patient should benefit from training in stress management and relaxation.     Screening Interventions   Interventions Encouraged to exercise      Quality of Life Scores:     Quality of Life - 06/25/16 1540      Quality of Life Scores   Health/Function Pre 20.06 %   Socioeconomic Pre 19.83 %   Psych/Spiritual Pre 20.33 %   Family Pre 21 %   GLOBAL Pre 20.19 %      PHQ-9: Recent Review Flowsheet Data    Depression screen [facility] 2/9 06/25/2016 09/26/2015 08/07/2015   Decreased Interest 1 1 1    Down, Depressed, Hopeless 0 0 0   PHQ - 2 Score 1  1 1    Altered sleeping 1 2 1    Tired, decreased energy 2 1 1    Change in appetite 1 2 0   Feeling bad or failure about yourself  0 0 0   Trouble concentrating 1 1 1    Moving slowly or fidgety/restless 0 1 0   Suicidal thoughts 0 0 0   PHQ-9 Score 6 8 4    Difficult doing work/chores Very difficult Somewhat difficult Somewhat difficult     Interpretation of Total Score  Total Score Depression Severity:  1-4 = Minimal depression, 5-9 = Mild depression, 10-14 = Moderate depression, 15-19 = Moderately severe depression, 20-27 = Severe depression   Psychosocial Evaluation and Intervention:   Psychosocial Re-Evaluation:  Psychosocial Re-Evaluation    Row Name 07/15/16 1428             Psychosocial Re-Evaluation   Comments Mr Dudek has attended [facility] 3 sessions. He is still nervous about not wearing a heart monitor due to his past heart codes. He does have a strong [place] faith and has had several people praying for him which has helped the most with his anxiety of another cardiac episode.  He also worries that he is a burden to his wife, since Mr Rabbani can not drive and she has her own health conditions.        Expected Outcomes Short: continue exercising and learning from the health lectures. Long term: in 3 months, Mr Picklesimer will be driving and will share responsibilities with home activities.       Interventions Encouraged to attend Pulmonary Rehabilitation for the exercise;Relaxation education;Stress management education       Continue Psychosocial Services  Follow up required by counselor          Psychosocial Discharge (Final Psychosocial Re-Evaluation):     Psychosocial Re-Evaluation - 07/15/16 1428      Psychosocial Re-Evaluation   Comments Mr Birchall has attended [facility] 3 sessions. He is still nervous about not wearing a heart monitor due to his past heart codes. He does have a strong [place] faith and has had several people praying for him  which has helped the most with his anxiety of another cardiac episode.  He also worries that he is a burden to his wife, since Mr Sweeden can not drive and she has her own health conditions.    Expected Outcomes Short: continue exercising and learning from the health lectures. Long term: in 3 months, Mr Niazi will be driving and will share responsibilities with home activities.   Interventions Encouraged to attend Pulmonary Rehabilitation for the exercise;Relaxation education;Stress management education   Continue Psychosocial Services  Follow up required by counselor      Education: Education Goals: Education classes will be provided on a weekly basis, covering required topics. Participant will state understanding/return demonstration of topics presented.  Learning Barriers/Preferences:     Learning Barriers/Preferences - 06/25/16 1517      Learning Barriers/Preferences   Learning Barriers Exercise Concerns   Learning Preferences None      Education Topics: Initial Evaluation Education: - Verbal, written and demonstration of respiratory meds, RPE/PD scales, oximetry and breathing techniques. Instruction on use of nebulizers and MDIs: cleaning and proper use, rinsing mouth with steroid doses and importance of monitoring MDI activations.   Pulmonary Rehab from 07/03/2016 in [facility] Cardiac and Pulmonary Rehab  Date  06/25/16  Educator  LB  Instruction Review Code  2- meets goals/outcomes      General Nutrition Guidelines/Fats and Fiber: -Group instruction provided by verbal, written material, models and posters to present the general guidelines for heart healthy nutrition. Gives an explanation and review of dietary fats and fiber.   Cardiac Rehab from 10/31/2015 in [facility] Cardiac and Pulmonary Rehab  Date  10/10/15  Educator  CR  Instruction Review Code  R- Review/reinforce [Second Class]      Controlling Sodium/Reading Food Labels: -Group verbal and written material  supporting the discussion of sodium use in heart healthy nutrition. Review and explanation with models, verbal and written materials for utilization of the food label.   Cardiac Rehab from 10/31/2015 in [facility] Cardiac and Pulmonary Rehab  Date  08/22/15  Educator  Connye Burkitt  Instruction Review Code  2- meets goals/outcomes      Exercise Physiology & Risk Factors: - Group verbal and written instruction with models to review the exercise physiology of the cardiovascular system and associated critical values. Details cardiovascular disease risk factors and the goals associated with each risk factor.   Cardiac Rehab from 10/31/2015 in [facility] Cardiac and Pulmonary Rehab  Date  10/24/15  Educator  [facility]  Instruction Review Code  2- meets goals/outcomes      Aerobic Exercise & Resistance Training: - Gives group verbal and written discussion on the health impact of inactivity. On the components of aerobic and resistive training programs and the benefits of this training and how to safely progress through these programs.   Cardiac Rehab from 10/31/2015 in [facility] Cardiac and Pulmonary Rehab  Date  10/26/15  Educator  [facility]  Instruction Review Code  2- meets goals/outcomes      Flexibility, Balance, General Exercise Guidelines: - Provides group verbal and written instruction on the benefits of flexibility and balance training programs. Provides general exercise guidelines with specific guidelines to those with heart or lung disease. Demonstration and skill practice provided.   Cardiac Rehab from 10/31/2015 in [facility] Cardiac and Pulmonary Rehab  Date  10/31/15  Educator  [facility]  Instruction Review Code  2- meets goals/outcomes      Stress Management: - Provides group verbal and written instruction about the health risks of elevated stress, cause of high stress, and healthy ways to reduce stress.   Cardiac Rehab from 10/31/2015 in [facility] Cardiac and Pulmonary Rehab  Date  09/07/15  Educator  CE  Instruction  Review Code  2- meets goals/outcomes      Depression: - Provides group verbal and written instruction on the correlation between heart/lung disease and depressed mood, treatment options, and the stigmas associated with seeking treatment.   Cardiac Rehab from 10/31/2015 in [facility] Cardiac and Pulmonary Rehab  Date  08/17/15  Educator  CE  Instruction Review Code  2- meets goals/outcomes      Exercise & Equipment Safety: - Individual verbal instruction and demonstration of equipment use and safety with use of the equipment.   Infection Prevention: - Provides verbal and written material to individual with discussion of infection control including proper hand washing and proper equipment cleaning during exercise session.   Falls Prevention: - Provides verbal and written material to individual with discussion of falls prevention and safety.   Diabetes: - Individual verbal and written instruction to review signs/symptoms of diabetes, desired ranges of glucose level fasting, after meals and with exercise. Advice that pre and post exercise glucose checks will be done for 3 sessions at entry of program.   Chronic Lung Diseases: - Group verbal and written instruction to review new updates, new respiratory medications, new advancements in procedures and treatments. Provide informative websites and "800" numbers of self-education.   Lung Procedures: - Group verbal and written instruction to describe testing methods done to diagnose lung disease. Review the outcome of test results. Describe the treatment choices: Pulmonary Function Tests, ABGs and oximetry.   Energy Conservation: - Provide group verbal and written instruction for methods to conserve energy, plan and organize activities. Instruct on pacing techniques, use of adaptive equipment and posture/positioning to relieve shortness of breath.   Pulmonary Rehab from 07/03/2016 in [facility] Cardiac and Pulmonary Rehab  Date  07/03/16  Educator   [facility]  Instruction Review Code  2- meets goals/outcomes      Triggers: - Group verbal and written instruction to  review types of environmental controls: home humidity, furnaces, filters, dust mite/pet prevention, HEPA vacuums. To discuss weather changes, air quality and the benefits of nasal washing.   Exacerbations: - Group verbal and written instruction to provide: warning signs, infection symptoms, calling MD promptly, preventive modes, and value of vaccinations. Review: effective airway clearance, coughing and/or vibration techniques. Create an Sport and exercise psychologistAction Plan.   Oxygen: - Individual and group verbal and written instruction on oxygen therapy. Includes supplement oxygen, available portable oxygen systems, continuous and intermittent flow rates, oxygen safety, concentrators, and Medicare reimbursement for oxygen.   Respiratory Medications: - Group verbal and written instruction to review medications for lung disease. Drug class, frequency, complications, importance of spacers, rinsing mouth after steroid MDI's, and proper cleaning methods for nebulizers.   [facility] Pulmonary Re[facility]  Date  06/25/16  Educator  LB  Instruction Review Code  2- meets goals/outcomes      AED/CPR: - Group verbal and written instruction with the use of models to demonstrate the basic use of the AED with the basic ABC's of resuscitation.   Breathing Retraining: - Provides individuals verbal and written instruction on purpose, frequency, and proper technique of diaphragmatic breathing and pursed-lipped breathing. Applies individual practice skills.   [facility] Card[facility] Pulmonary Rehab  Date  06/25/16  Educator  LB  Instruction Review Code  2- meets goals/outcomes      Anatomy and Physiology of the Lungs: - Group verbal and written instruction with the use of models to provide basic lung anatomy and physiology related to function,  structure and complications of lung disease.   Heart Failure: - Group verbal and written instruction on the basics of heart failure: signs/symptoms, treatments, explanation of ejection fraction, enlarged heart and cardiomyopathy.   Sleep Apnea: - Individual verbal and written instruction to review Obstructive Sleep Apnea. Review of risk factors, methods for diagnosing and types of masks and machines for OSA.   Pulmonary Rehab from 07/03/2016 [facility] Cardiac and Pulmonary Rehab  Date  06/25/16  Educator  LB  Instruction Review Code  2- meets goals/outcomes      Anxiety: - Provides group, verbal and written instruction on the correlation between heart/lung disease and anxiety, treatment options, and management of anxiety.   Relaxation: - Provides group, verbal and written instruction about the benefits of relaxation for patients with heart/lung disease. Also provides patients with examples of relaxation techniques.   Knowledge Questionnaire Score:     Knowledge Questionnaire Score - 06/25/16 1517      Knowledge Questionnaire Score   Pre Score 10/10       Core Components/Risk Factors/Patient Goals at Admission:     Personal Goals and Risk Factors at Admission - 06/25/16 1522      Core Components/Risk Factors/Patient Goals on Admission    Weight Management Yes;Weight Loss   Intervention Weight Management: Develop a combined nutrition and exercise program designed to reach desired caloric intake, while maintaining appropriate intake of nutrient and fiber, sodium and fats, and appropriate energy expenditure required for the weight goal.;Weight Management: Provide education and appropriate resources to help participant work on and attain dietary goals.;Weight Management/Obesity: Establish reasonable short term and long term weight goals.;Obesity: Provide education and appropriate resources to help participant work on and attain dietary goals.   Admit Weight 271 lb 3.2 oz (123 kg)    Goal Weight: Short Term 266 lb (120.7 kg)   Goal Weight: Long Term 220 lb (  99.8 kg)   Expected Outcomes Long Term: Adherence to nutrition and physical activity/exercise program aimed toward attainment of established weight goal;Short Term: Continue to assess and modify interventions until short term weight is achieved;Weight Maintenance: Understanding of the daily nutrition guidelines, which includes 25-35% calories from fat, 7% or less cal from saturated fats, less than 200mg  cholesterol, less than 1.5gm of sodium, & 5 or more servings of fruits and vegetables daily;Weight Loss: Understanding of general recommendations for a balanced deficit meal plan, which promotes 1-2 lb weight loss per week and includes a negative energy balance of [phone] kcal/d;Understanding recommendations for meals to include 15-35% energy as protein, 25-35% energy from fat, 35-60% energy from carbohydrates, less than 200mg  of dietary cholesterol, 20-35 gm of total fiber daily;Understanding of distribution of calorie intake throughout the day with the consumption of 4-5 meals/snacks   Improve shortness of breath with ADL's Yes   Intervention Provide education, individualized exercise plan and daily activity instruction to help decrease symptoms of SOB with activities of daily living.   Expected Outcomes Short Term: Achieves a reduction of symptoms when performing activities of daily living.   Develop more efficient breathing techniques such as purse lipped breathing and diaphragmatic breathing; and practicing self-pacing with activity Yes   Intervention Provide education, demonstration and support about specific breathing techniuqes utilized for more efficient breathing. Include techniques such as pursed lipped breathing, diaphragmatic breathing and self-pacing activity.   Expected Outcomes Short Term: Participant will be able to demonstrate and use breathing techniques as needed throughout daily activities.   Increase knowledge  of respiratory medications and ability to use respiratory devices properly  Yes  Anoro E and Albuterol MDI; spacer given   Intervention Provide education and demonstration as needed of appropriate use of medications, inhalers, and oxygen therapy.   Expected Outcomes Short Term: Achieves understanding of medications use. Understands that oxygen is a medication prescribed by physician. Demonstrates appropriate use of inhaler and oxygen therapy.   Heart Failure Yes   Intervention Provide a combined exercise and nutrition program that is supplemented with education, support and counseling about heart failure. Directed toward relieving symptoms such as shortness of breath, decreased exercise tolerance, and extremity edema.   Expected Outcomes Improve functional capacity of life;Short term: Attendance in program 2-3 days a week with increased exercise capacity. Reported lower sodium intake. Reported increased fruit and vegetable intake. Reports medication compliance.;Short term: Daily weights obtained and reported for increase. Utilizing diuretic protocols set by physician.;Long term: Adoption of self-care skills and reduction of barriers for early signs and symptoms recognition and intervention leading to self-care maintenance.   Hypertension Yes   Intervention Provide education on lifestyle modifcations including regular physical activity/exercise, weight management, moderate sodium restriction and increased consumption of fresh fruit, vegetables, and low fat dairy, alcohol moderation, and smoking cessation.;Monitor prescription use compliance.   Expected Outcomes Short Term: Continued assessment and intervention until BP is < 140/73mm HG in hypertensive participants. < 130/20mm HG in hypertensive participants with diabetes, heart failure or chronic kidney disease.;Long Term: Maintenance of blood pressure at goal levels.   Lipids Yes   Intervention Provide education and support for participant on nutrition &  aerobic/resistive exercise along with prescribed medications to achieve LDL 70mg , HDL >40mg .   Expected Outcomes Short Term: Participant states understanding of desired cholesterol values and is compliant with medications prescribed. Participant is following exercise prescription and nutrition guidelines.;Long Term: Cholesterol controlled with medications as prescribed, with individualized exercise RX and with personalized nutrition plan. Value  goals: LDL < 70mg , HDL > 40 mg.      Core Components/Risk Factors/Patient Goals Review:      Goals and Risk Factor Review    Row Name 07/15/16 1419             Core Components/Risk Factors/Patient Goals Review   Personal Goals Review Weight Management/Obesity;Develop more efficient breathing techniques such as purse lipped breathing and diaphragmatic breathing and practicing self-pacing with activity.;Improve shortness of breath with ADL's       Review Mr Nemetz has completed 3 sessions of [facility]. He is cued to do PLB and does have shortness of breath with most activity. He would like to mow at home, but with the heat and shortness of breath, he has not been able to mow, but he hopes to in the future while improving in [facility]. He is maintaining his weight at 273lbs, does not want to meet with the dietitian at this time, and states his wife cooks healthy.Mr Solem does walk [facility] the [facility] 3-4 days/week, even on [facility] days.       Expected Outcomes Long term: Continue to attend [facility] to increase his stamina and conditioning to improve his home exercise; Short: Learn to use PLB without cueing.          Core Components/Risk Factors/Patient Goals at Discharge (Final Review):      Goals and Risk Factor Review - 07/15/16 1419      Core Components/Risk Factors/Patient Goals Review   Personal Goals Review Weight Management/Obesity;Develop more efficient breathing techniques such as purse lipped breathing and diaphragmatic  breathing and practicing self-pacing with activity.;Improve shortness of breath with ADL's   Review Mr Salehi has completed 3 sessions of [facility]. He is cued to do PLB and does have shortness of breath with most activity. He would like to mow at home, but with the heat and shortness of breath, he has not been able to mow, but he hopes to in the future while improving in [facility]. He is maintaining his weight at 273lbs, does not want to meet with the dietitian at this time, and states his wife cooks healthy.Mr Favila does walk at the [facility] 3-4 days/week, even on [facility] days.   Expected Outcomes Long term: Continue to attend [facility] to increase his stamina and conditioning to improve his home exercise; Short: Learn to use PLB without cueing.      ITP Comments:     ITP Comments    Row Name 07/10/16 1306 07/22/16 0741         ITP Comments Annette Stable has been absent due to other Dr appointments. 30 day note review with Medical Director of [facility] Dr. Bethann Punches         Comments: 30 day note review with Medical Director of [facility] Dr. Bethann Punches

## 2016-07-26 ENCOUNTER — Encounter: Payer: Medicare HMO | Admitting: *Deleted

## 2016-07-26 DIAGNOSIS — I208 Other forms of angina pectoris: Secondary | ICD-10-CM

## 2016-07-26 DIAGNOSIS — J449 Chronic obstructive pulmonary disease, unspecified: Secondary | ICD-10-CM | POA: Diagnosis not present

## 2016-07-26 NOTE — Progress Notes (Signed)
Daily Session Note  Patient Details  Name: Bradley C Hall Jr. MRN: [id number] Date of Birth: 08/10/1940 Referring Provider:     Pulmonary Rehab from 06/25/2016 in [facility]  Referring Provider  Klein, Bert MD      Encounter Date: 07/26/2016  Check In:     Session Check In - 07/26/16 1042      Check-In   Location [facility] Pul[facility]   Staff Present Joseph Hood RCP,RRT,BSRT;Amanda Sommer, BA, ACSM CEP, Exercise Physiologist;Carroll Enterkin, RN, BSN   Supervising physician immediately available to respond to emergencies See telemetry face sheet for immediately available ER MD   Medication changes reported     No   Fall or balance concerns reported    No   Tobacco Cessation No Change   Warm-up and Cool-down Performed on first and last piece of equipment   Resistance Training Performed Yes   VAD Patient? No     Pain Assessment   Currently in Pain? No/denies         History  Smoking Status  . Former Smoker  . Packs/day: 1.00  . Years: 33.00  . Types: Cigarettes  . Quit date: 10/03/1991  Smokeless Tobacco  . Never Used    Goals Met:  Proper associated with RPD/PD & O2 Sat Exercise tolerated well  Goals Unmet:  Not Applicable  Comments:     Dr. Mark Miller is Medical Director for [facility] and [facility]. 

## 2016-07-31 DIAGNOSIS — I208 Other forms of angina pectoris: Secondary | ICD-10-CM

## 2016-07-31 DIAGNOSIS — J449 Chronic obstructive pulmonary disease, unspecified: Secondary | ICD-10-CM | POA: Diagnosis not present

## 2016-07-31 DIAGNOSIS — I2089 Other forms of angina pectoris: Secondary | ICD-10-CM

## 2016-07-31 NOTE — Progress Notes (Signed)
Daily Session Note  Patient Details  Name: Bradley Barnes. MRN: [id number] Date of Birth: 10-Sep-1940 Referring Provider:     Pulmonary Rehab from 06/25/2016 in [facility] Card[facility] Rehab  Referring Provider  Ramonita Lab MD      Encounter Date: 07/31/2016  Check In:     Session Check In - 07/31/16 1035      Check-In   Location [facility]   Staff Present Nyoka Cowden, RN, BSN, Kela Millin, BA, ACSM CEP, Exercise Physiologist;Armetta Henri Flavia Shipper   Supervising physician immediately available to respond to emergencies [facility] immediately available ER MD   Physician(s) Dr. Kerman Passey and Quentin Cornwall   Medication changes reported     No   Fall or balance concerns reported    No   Tobacco Cessation No Change   Warm-up and Cool-down Performed as group-led instruction   Resistance Training Performed Yes   VAD Patient? No     Pain Assessment   Currently in Pain? No/denies   Multiple Pain Sites No         History  Smoking Status  . Former Smoker  . Packs/day: 1.00  . Years: 33.00  . Types: Cigarettes  . Quit date: 10/03/1991  Smokeless Tobacco  . Never Used    Goals Met:  Proper associated with RPD/PD & O2 Sat Independence with exercise equipment Exercise tolerated well Strength training completed today  Goals Unmet:  Not Applicable  Comments: Pt able to follow exercise prescription today without complaint.  Will continue to monitor for progression.   Dr. Emily Filbert is Medical Director for [place] and [facility].

## 2016-08-02 DIAGNOSIS — J449 Chronic obstructive pulmonary disease, unspecified: Secondary | ICD-10-CM | POA: Diagnosis not present

## 2016-08-02 DIAGNOSIS — I208 Other forms of angina pectoris: Secondary | ICD-10-CM

## 2016-08-02 NOTE — Progress Notes (Signed)
Daily Session Note  Patient Details  Name: Bradley Barnes. MRN: [id number] Date of Birth: 17-Apr-1940 Referring Provider:     Pulmonary Rehab from 06/25/2016 in [facility] Cardiac and Pulmonary Rehab  Referring Provider  Ramonita Lab MD      Encounter Date: 08/02/2016  Check In:     Session Check In - 08/02/16 1020      Check-In   Location [facility]   Staff Present Nyoka Cowden, RN, BSN, Kela Millin, BA, ACSM CEP, Exercise Physiologist;Joseph Flavia Shipper   Supervising physician immediately available to respond to emergencies [facility] immediately available ER MD   Physician(s) Dr. Archie Balboa and Corky Downs   Medication changes reported     No   Fall or balance concerns reported    No   Tobacco Cessation No Change   Warm-up and Cool-down Performed as group-led instruction   Resistance Training Performed Yes   VAD Patient? No     Pain Assessment   Currently in Pain? No/denies   Multiple Pain Sites No         History  Smoking Status  . Former Smoker  . Packs/day: 1.00  . Years: 33.00  . Types: Cigarettes  . Quit date: 10/03/1991  Smokeless Tobacco  . Never Used    Goals Met:  Proper associated with RPD/PD & O2 Sat Independence with exercise equipment Exercise tolerated well Strength training completed today  Goals Unmet:  Not Applicable  Comments: Reviewed home exercise with pt today.  Pt plans to walk  for exercise.  Reviewed THR, pulse, RPE, sign and symptoms, NTG use, and when to call 911 or MD.  Also discussed weather considerations and indoor options.  Pt voiced understanding.    Dr. Emily Filbert is Medical Director for [place] and [facility].

## 2016-08-05 ENCOUNTER — Encounter: Payer: Medicare HMO | Admitting: *Deleted

## 2016-08-05 DIAGNOSIS — I2089 Other forms of angina pectoris: Secondary | ICD-10-CM

## 2016-08-05 DIAGNOSIS — J449 Chronic obstructive pulmonary disease, unspecified: Secondary | ICD-10-CM | POA: Diagnosis not present

## 2016-08-05 DIAGNOSIS — I208 Other forms of angina pectoris: Secondary | ICD-10-CM

## 2016-08-05 NOTE — Progress Notes (Signed)
Daily Session Note  Patient Details  Name: Bradley C Kopischke Jr. MRN: [id number] Date of Birth: 12/12/1940 Referring Provider:     Pulmonary Rehab from 06/25/2016 in [facility]  Referring Provider  Klein, Bert MD      Encounter Date: 08/05/2016  Check In:     Session Check In - 08/05/16 1019      Check-In   Location [facility]   Staff Present Joseph Hood RCP,RRT,BSRT;Kelly Hayes, BS, ACSM CEP, Exercise Physiologist;Amanda Sommer, BA, ACSM CEP, Exercise Physiologist   Supervising physician immediately available to respond to emergencies [facility] immediately available ER MD   Physician(s) Dr. Paduchowski and Dr. Veronese   Medication changes reported     No   Fall or balance concerns reported    No   Warm-up and Cool-down Performed as group-led instruction   Resistance Training Performed Yes   VAD Patient? No     Pain Assessment   Currently in Pain? No/denies   Multiple Pain Sites No         History  Smoking Status  . Former Smoker  . Packs/day: 1.00  . Years: 33.00  . Types: Cigarettes  . Quit date: 10/03/1991  Smokeless Tobacco  . Never Used    Goals Met:  Proper associated with RPD/PD & O2 Sat Independence with exercise equipment Exercise tolerated well Personal goals reviewed Strength training completed today  Goals Unmet:  Not Applicable  Comments: Pt able to follow exercise prescription today without complaint.  Will continue to monitor for progression.    Dr. Mark Miller is Medical Director for [facility] and [facility]. 

## 2016-08-07 ENCOUNTER — Encounter: Payer: Medicare HMO | Admitting: *Deleted

## 2016-08-07 DIAGNOSIS — I208 Other forms of angina pectoris: Secondary | ICD-10-CM

## 2016-08-07 DIAGNOSIS — J449 Chronic obstructive pulmonary disease, unspecified: Secondary | ICD-10-CM | POA: Diagnosis not present

## 2016-08-07 NOTE — Progress Notes (Signed)
Daily Session Note  Patient Details  Name: Bradley Barnes. MRN: [id number] Date of Birth: 08/02/1940 Referring Provider:     Pulmonary Rehab from 06/25/2016 in [facility] Re[facility]  Referring Provider  Ramonita Lab MD      Encounter Date: 08/07/2016  Check In:     Session Check In - 08/07/16 1032      Check-In   Location [facility]   Staff Present Alberteen Sam, MA, ACSM RCEP, Exercise Physiologist;Joseph Christain [place], RN BSN   Supervising physician immediately available to respond to emergencies [facility] immediately available ER MD   Physician(s) Drs. Lord and [employer]   Medication changes reported     No   Fall or balance concerns reported    No   Tobacco Cessation No Change   Warm-up and Cool-down Performed as group-led Location manager Performed No   VAD Patient? No     Pain Assessment   Currently in Pain? No/denies   Multiple Pain Sites No         History  Smoking Status  . Former Smoker  . Packs/day: 1.00  . Years: 33.00  . Types: Cigarettes  . Quit date: 10/03/1991  Smokeless Tobacco  . Never Used    Goals Met:  Proper associated with RPD/PD & O2 Sat Independence with exercise equipment Using PLB without cueing & demonstrates good technique Exercise tolerated well Strength training completed today  Goals Unmet:  Not Applicable  Comments: Pt able to follow exercise prescription today without complaint.  Will continue to monitor for progression.    Dr. Emily Filbert is Medical Director for [place] and [facility].

## 2016-08-12 ENCOUNTER — Encounter: Payer: Medicare HMO | Admitting: *Deleted

## 2016-08-12 DIAGNOSIS — J449 Chronic obstructive pulmonary disease, unspecified: Secondary | ICD-10-CM

## 2016-08-12 DIAGNOSIS — I208 Other forms of angina pectoris: Secondary | ICD-10-CM

## 2016-08-12 NOTE — Progress Notes (Signed)
Daily Session Note  Patient Details  Name: Bradley Barnes. MRN: [id number] Date of Birth: 04/24/1940 Referring Provider:     Pulmonary Rehab from 06/25/2016 in [facility] Cardiac and Pulmonary Rehab  Referring Provider  Ramonita Lab MD      Encounter Date: 08/12/2016  Check In:     Session Check In - 08/12/16 1009      Check-In   Location [facility]   Staff Present Earlean Shawl, BS, ACSM CEP, Exercise Physiologist;Joseph Foy [place], BA, ACSM CEP, Exercise Physiologist   Supervising physician immediately available to respond to emergencies [facility] immediately available ER MD   Physician(s) Dr. Cinda Quest and Dr. Mariea Clonts   Medication changes reported     No   Fall or balance concerns reported    No   Warm-up and Cool-down Performed on first and last piece of equipment   Resistance Training Performed Yes   VAD Patient? No     Pain Assessment   Currently in Pain? No/denies   Multiple Pain Sites No         History  Smoking Status  . Former Smoker  . Packs/day: 1.00  . Years: 33.00  . Types: Cigarettes  . Quit date: 10/03/1991  Smokeless Tobacco  . Never Used    Goals Met:  Proper associated with RPD/PD & O2 Sat Independence with exercise equipment Exercise tolerated well Strength training completed today  Goals Unmet:  Not Applicable  Comments: Pt able to follow exercise prescription today without complaint.  Will continue to monitor for progression.    Dr. Emily Filbert is Medical Director for [place] and [facility].

## 2016-08-14 DIAGNOSIS — I208 Other forms of angina pectoris: Secondary | ICD-10-CM

## 2016-08-14 DIAGNOSIS — J449 Chronic obstructive pulmonary disease, unspecified: Secondary | ICD-10-CM

## 2016-08-14 NOTE — Progress Notes (Signed)
Daily Session Note  Patient Details  Name: Bradley Barnes. MRN: [id number] Date of Birth: 21-Nov-1940 Referring Provider:     Pulmonary Rehab from 06/25/2016 in [facility] Cardiac and Pulmonary Rehab  Referring Provider  Ramonita Lab MD      Encounter Date: 08/14/2016  Check In:     Session Check In - 08/14/16 1027      Check-In   Location [facility]   Staff Present Alberteen Sam, MA, ACSM RCEP, Exercise Physiologist;Amanda Oletta Darter, BA, ACSM CEP, Exercise Physiologist;Zaven Klemens Flavia Shipper   Supervising physician immediately available to respond to emergencies [facility] immediately available ER MD   Physician(s) Dr. Joni Fears and Burlene Arnt   Medication changes reported     No   Fall or balance concerns reported    No   Warm-up and Cool-down Performed as group-led instruction   Resistance Training Performed Yes   VAD Patient? No     Pain Assessment   Currently in Pain? No/denies   Multiple Pain Sites No         History  Smoking Status  . Former Smoker  . Packs/day: 1.00  . Years: 33.00  . Types: Cigarettes  . Quit date: 10/03/1991  Smokeless Tobacco  . Never Used    Goals Met:  Proper associated with RPD/PD & O2 Sat Independence with exercise equipment Exercise tolerated well Strength training completed today  Goals Unmet:  Not Applicable  Comments: Pt able to follow exercise prescription today without complaint.  Will continue to monitor for progression.   Dr. Emily Filbert is Medical Director for [place] and [facility].

## 2016-08-19 ENCOUNTER — Encounter: Payer: Medicare HMO | Admitting: *Deleted

## 2016-08-19 DIAGNOSIS — J449 Chronic obstructive pulmonary disease, unspecified: Secondary | ICD-10-CM

## 2016-08-19 DIAGNOSIS — I208 Other forms of angina pectoris: Secondary | ICD-10-CM

## 2016-08-19 DIAGNOSIS — I2089 Other forms of angina pectoris: Secondary | ICD-10-CM

## 2016-08-19 NOTE — Progress Notes (Signed)
Pulmonary Individual Treatment Plan  Patient Details  Name: Bradley Barnes. MRN: [id number] Date of Birth: [birth year] Referring Provider:     Pulmonary Rehab from 06/25/2016 in [facility] Cardiac and Pulmonary Rehab  Referring Provider  Ramonita Lab MD      Initial Encounter Date:    Pulmonary Rehab from 06/25/2016 in [facility] Cardiac and Pulmonary Rehab  Date  06/25/16  Referring Provider  Ramonita Lab MD      Visit Diagnosis: Chronic obstructive pulmonary disease, unspecified COPD type ([place])  Stable angina ([place])  Patient's Home Medications on Admission:  Current Outpatient Prescriptions:  .  Acetaminophen 500 MG coapsule, Take by mouth., Disp: , Rfl:  .  albuterol (PROAIR HFA) 108 (90 Base) MCG/ACT inhaler, Inhale into the lungs., Disp: , Rfl:  .  Alum Hydroxide-Mag Carbonate (GAVISCON EXTRA RELIEF FORMULA PO), Take by mouth., Disp: , Rfl:  .  amLODipine (NORVASC) 10 MG tablet, Take by mouth., Disp: , Rfl:  .  aspirin EC 81 MG tablet, Take by mouth., Disp: , Rfl:  .  atorvastatin (LIPITOR) 40 MG tablet, Take by mouth., Disp: , Rfl:  .  furosemide (LASIX) 40 MG tablet, Take 40 mg by mouth., Disp: , Rfl:  .  Glycopyrrolate-Formoterol (BEVESPI AEROSPHERE) 9-4.8 MCG/ACT AERO, Inhale 2 puffs into the lungs 2 (two) times daily., Disp: 10.7 g, Rfl: 5 .  Investigational - Study Medication, Take by mouth. [facility] IDS, Disp: , Rfl:  .  isosorbide mononitrate (IMDUR) 60 MG 24 hr tablet, Take by mouth., Disp: , Rfl:  .  metoprolol (TOPROL-XL) 200 MG 24 hr tablet, Take by mouth., Disp: , Rfl:  .  mexiletine (MEXITIL) 200 MG capsule, Take 200 mg by mouth 3 (three) times daily., Disp: , Rfl:  .  Multiple Vitamin (MULTI-VITAMINS) TABS, Take by mouth., Disp: , Rfl:  .  nitroGLYCERIN (NITROSTAT) 0.4 MG SL tablet, Place under the tongue., Disp: , Rfl:  .  Omega-3 1000 MG CAPS, Take by mouth., Disp: , Rfl:  .  ramipril (ALTACE) 10 MG capsule, Take by mouth., Disp: , Rfl:  .  ranitidine (ZANTAC)  300 MG tablet, Take by mouth., Disp: , Rfl:  .  sodium chloride (OCEAN) 0.65 % nasal spray, Place 1 spray into the nose as needed for congestion., Disp: , Rfl:  .  sodium chloride (V-R NASAL SPRAY SALINE) 0.65 % nasal spray, , Disp: , Rfl:  .  tamsulosin (FLOMAX) 0.4 MG CAPS capsule, Take 0.4 mg by mouth., Disp: , Rfl:  .  tiotropium (SPIRIVA) 18 MCG inhalation capsule, Place into inhaler and inhale., Disp: , Rfl:  .  Tiotropium Bromide Monohydrate (SPIRIVA RESPIMAT) 2.5 MCG/ACT AERS, Inhale 2 puffs into the lungs daily., Disp: 1 Inhaler, Rfl: 0 .  UNABLE TO FIND, Place 1 drop into both eyes daily. Med Name: Dextran 70, Disp: , Rfl:   Past Medical History: Past Medical History:  Diagnosis Date  . Chronic kidney disease   . COPD (chronic obstructive pulmonary disease) ([place])   . Hyperlipidemia   . Hypertension   . Myocardial infarction   . Sleep apnea     Tobacco Use: History  Smoking Status  . Former Smoker  . Packs/day: 1.00  . Years: 33.00  . Types: Cigarettes  . Quit date: 10/03/1991  Smokeless Tobacco  . Never Used    Labs: Recent Review Flowsheet Data    There is no flowsheet data to display.       ADL UCSD:     Pulmonary  Assessment Scores    Row Name 06/25/16 1519 08/12/16 1354       ADL UCSD   ADL Phase Entry Mid    SOB Score total 52 47    Rest 0 1    Walk 3 2    Stairs 4 5    Bath 2 1    Dress 2 0    Shop 4 4      mMRC Score   mMRC Score 2  -       Pulmonary Function Assessment:     Pulmonary Function Assessment - 06/25/16 1517      Pulmonary Function Tests   RV% 84 %   DLCO% 50 %     Initial Spirometry Results   FVC% 62 %   FEV1% 55 %   FEV1/FVC Ratio 64   Comments Test date 09/20/15     Post Bronchodilator Spirometry Results   FVC% 60 %   FEV1% 57 %   FEV1/FVC Ratio 68     Breath   Bilateral Breath Sounds Clear   Shortness of Breath Yes;Fear of Shortness of Breath;Limiting activity      Exercise Target Goals:    Exercise  Program Goal: Individual exercise prescription set with THRR, safety & activity barriers. Participant demonstrates ability to understand and report RPE using BORG scale, to self-measure pulse accurately, and to acknowledge the importance of the exercise prescription.  Exercise Prescription Goal: Starting with aerobic activity 30 plus minutes a day, 3 days per week for initial exercise prescription. Provide home exercise prescription and guidelines that participant acknowledges understanding prior to discharge.  Activity Barriers & Risk Stratification:     Activity Barriers & Cardiac Risk Stratification - 06/25/16 1426      Activity Barriers & Cardiac Risk Stratification   Activity Barriers Arthritis;Chest Pain/Angina;Joint Problems;Deconditioning;Muscular Weakness;Right Hip Replacement;Left Knee Replacement;Back Problems;Assistive Device;Shortness of Breath;Decreased Ventricular Function;Balance Concerns  uses rolling walker for support and balance   Cardiac Risk Stratification High      6 Minute Walk:     6 Minute Walk    Row Name 06/25/16 1421         6 Minute Walk   Phase Initial     Distance 730 feet     Walk Time 6 minutes     # of Rest Breaks 0     MPH 1.38     METS 0     RPE 16     Perceived Dyspnea  3     VO2 Peak 3.34     Symptoms Yes (comment)     Comments short of breath, using rolling walker     Resting HR 57 bpm     Resting BP 134/64     Max Ex. HR 72 bpm     Max Ex. BP 144/74     2 Minute Post BP 126/70       Interval HR   Baseline HR 57     4 Minute HR 72     5 Minute HR 72     6 Minute HR 72     2 Minute Post HR 56     Interval Heart Rate? Yes       Interval Oxygen   Interval Oxygen? Yes     Baseline Oxygen Saturation % 98 %     Baseline Liters of Oxygen 0 L  Room Air     1 Minute Liters of Oxygen 0 L     2 Minute Liters  of Oxygen 0 L     3 Minute Oxygen Saturation % 90 %     3 Minute Liters of Oxygen 0 L     4 Minute Oxygen Saturation % 95  %     4 Minute Liters of Oxygen 0 L     5 Minute Oxygen Saturation % 99 %     5 Minute Liters of Oxygen 0 L     6 Minute Oxygen Saturation % 99 %     6 Minute Liters of Oxygen 0 L     2 Minute Post Oxygen Saturation % 100 %     2 Minute Post Liters of Oxygen 0 L       Oxygen Initial Assessment:     Oxygen Initial Assessment - 06/25/16 1522      Home Oxygen   Home Oxygen Device None      Oxygen Re-Evaluation:     Oxygen Re-Evaluation    Row Name 07/15/16 1419 08/05/16 1522           Program Oxygen Prescription   Program Oxygen Prescription None None        Home Oxygen   Home Oxygen Device None None      Sleep Oxygen Prescription  - None      Home Exercise Oxygen Prescription  - None      Home at Rest Exercise Oxygen Prescription  - None        Goals/Expected Outcomes   Short Term Goals  - To learn and understand importance of monitoring SPO2 with pulse oximeter and demonstrate accurate use of the pulse oximeter.;To Learn and understand importance of maintaining oxygen saturations>88%;To learn and demonstrate proper purse lipped breathing techniques or other breathing techniques.;To learn and demonstrate proper use of respiratory medications      Long  Term Goals  - Exhibits compliance with exercise, home and travel O2 prescription;Maintenance of O2 saturations>88%;Compliance with respiratory medication;Verbalizes importance of monitoring SPO2 with pulse oximeter and return demonstration;Exhibits proper breathing techniques, such as purse lipped breathing or other method taught during program session;Demonstrates proper use of MDI's      Comments  - Patient has been doing well with his O2 saturations since he has been in the program. Patient feels like he needs to work on losing weight.      Goals/Expected Outcomes  - Short term: Understand when to check and the importance of monitoring O2 saturation. Long Term: use proper breathing techniques and monitor his own O2  saturation.          Oxygen Discharge (Final Oxygen Re-Evaluation):     Oxygen Re-Evaluation - 08/05/16 1522      Program Oxygen Prescription   Program Oxygen Prescription None     Home Oxygen   Home Oxygen Device None   Sleep Oxygen Prescription None   Home Exercise Oxygen Prescription None   Home at Rest Exercise Oxygen Prescription None     Goals/Expected Outcomes   Short Term Goals To learn and understand importance of monitoring SPO2 with pulse oximeter and demonstrate accurate use of the pulse oximeter.;To Learn and understand importance of maintaining oxygen saturations>88%;To learn and demonstrate proper purse lipped breathing techniques or other breathing techniques.;To learn and demonstrate proper use of respiratory medications   Long  Term Goals Exhibits compliance with exercise, home and travel O2 prescription;Maintenance of O2 saturations>88%;Compliance with respiratory medication;Verbalizes importance of monitoring SPO2 with pulse oximeter and return demonstration;Exhibits proper breathing techniques, such as purse lipped breathing or  other method taught during program session;Demonstrates proper use of MDI's   Comments Patient has been doing well with his O2 saturations since he has been in the program. Patient feels like he needs to work on losing weight.   Goals/Expected Outcomes Short term: Understand when to check and the importance of monitoring O2 saturation. Long Term: use proper breathing techniques and monitor his own O2 saturation.       Initial Exercise Prescription:     Initial Exercise Prescription - 06/25/16 1400      Date of Initial Exercise RX and Referring Provider   Date 06/25/16   Referring Provider Ramonita Lab MD     Treadmill   MPH 1.1   Grade 0   Minutes 15   METs 1.84     Bike   Level 0.5   Watts 10   Minutes 15   METs 1.5     T5 Nustep   Level 1   SPM 80   Minutes 15   METs 1.5     Track   Laps 15   Minutes 15   METs  1.7     Prescription Details   Frequency (times per week) 3   Duration Progress to 45 minutes of aerobic exercise without signs/symptoms of physical distress     Intensity   THRR 40-80% of Max Heartrate 91-127   Ratings of Perceived Exertion 11-13   Perceived Dyspnea 0-4     Progression   Progression Continue to progress workloads to maintain intensity without signs/symptoms of physical distress.     Resistance Training   Training Prescription Yes   Weight 3 lbs   Reps 10-15      Perform Capillary Blood Glucose checks as needed.  Exercise Prescription Changes:     Exercise Prescription Changes    Row Name 07/03/16 1200 07/23/16 1300 08/02/16 1300 08/07/16 1500       Response to Exercise   Blood Pressure (Admit) 102/64 112/62  - 124/72    Blood Pressure (Exercise) 146/64 120/70  - 142/84    Blood Pressure (Exit) 104/58 102/60  - 132/76    Heart Rate (Admit) 56 bpm 57 bpm  - 61 bpm    Heart Rate (Exercise) 64 bpm 60 bpm  - 65 bpm    Heart Rate (Exit) 54 bpm 50 bpm  - 50 bpm    Oxygen Saturation (Admit) 100 % 98 %  - 98 %    Oxygen Saturation (Exercise) 99 % 198 %  - 97 %    Oxygen Saturation (Exit) 100 % 98 %  - 99 %    Rating of Perceived Exertion (Exercise) 14 14  - 14    Perceived Dyspnea (Exercise) 2 2.5  - 2    Duration Progress to 45 minutes of aerobic exercise without signs/symptoms of physical distress Progress to 45 minutes of aerobic exercise without signs/symptoms of physical distress  - Continue with 45 min of aerobic exercise without signs/symptoms of physical distress.    Intensity THRR unchanged THRR unchanged  - THRR unchanged      Progression   Progression Continue to progress workloads to maintain intensity without signs/symptoms of physical distress. Continue to progress workloads to maintain intensity without signs/symptoms of physical distress.  - Continue to progress workloads to maintain intensity without signs/symptoms of physical distress.     Average METs  - 2.38  - 3.28      Resistance Training   Training Prescription Yes Yes  - Yes  Weight 4 3  - 3    Reps 10-15 10-15  - 10-15    Time  -  -  - 2.29 Minutes      Interval Training   Interval Training  - No  - No      Oxygen   Liters  - 0  -  -      Bike   Level 0.5  -  -  -    Watts 10 31  - 50    Minutes 15 15  - 15    METs  - 2.76  - 3.28      T5 Nustep   Level 1 2  -  -    SPM 103 95  -  -    Minutes 15 15  -  -    METs 2.1 2  -  -      Track   Laps  -  -  - 20    Minutes  -  -  - 15    METs  -  -  - 1.92      Home Exercise Plan   Plans to continue exercise at  -  - [employer] (comment)  walk at [place]  -    Frequency  -  - Add 2 additional days to program exercise sessions.  -    Initial Home Exercises Provided  -  - 08/02/16  -       Exercise Comments:     Exercise Comments    Row Name 07/03/16 1247           Exercise Comments First full day of exercise!  Patient was oriented to gym and equipment including functions, settings, policies, and procedures.  Patient's individual exercise prescription and treatment plan were reviewed.  All starting workloads were established based on the results of the 6 minute walk test done at initial orientation visit.  The plan for exercise progression was also introduced and progression will be customized based on patient's performance and goals.          Exercise Goals and Review:     Exercise Goals    Row Name 06/25/16 1428             Exercise Goals   Increase Physical Activity Yes       Intervention Provide advice, education, support and counseling about physical activity/exercise needs.;Develop an individualized exercise prescription for aerobic and resistive training based on initial evaluation findings, risk stratification, comorbidities and participant's personal goals.       Expected Outcomes Achievement of increased cardiorespiratory fitness and enhanced flexibility,  muscular endurance and strength shown through measurements of functional capacity and personal statement of participant.       Increase Strength and Stamina Yes       Intervention Provide advice, education, support and counseling about physical activity/exercise needs.;Develop an individualized exercise prescription for aerobic and resistive training based on initial evaluation findings, risk stratification, comorbidities and participant's personal goals.       Expected Outcomes Achievement of increased cardiorespiratory fitness and enhanced flexibility, muscular endurance and strength shown through measurements of functional capacity and personal statement of participant.          Exercise Goals Re-Evaluation :     Exercise Goals Re-Evaluation    Row Name 07/23/16 1319 08/02/16 1346 08/07/16 1517         Exercise Goal Re-Evaluation   Exercise Goals Review Increase Physical Activity;Increase Strenth  and Stamina Increase Physical Activity;Increase Strenth and Stamina Increase Physical Activity;Increase Strenth and Stamina     Comments Rush Landmark is tolerating exercise well so far.    Reviewed home exercise with pt today.  Pt plans to walk  for exercise.  Reviewed THR, pulse, RPE, sign and symptoms, NTG use, and when to call 911 or MD.  Also discussed weather considerations and indoor options.  Pt voiced understanding. Rush Landmark is increasing his MET level overall.  He is walking at the [facility] on days he doesnt attend  [facility].     Expected Outcomes Short- Rush Landmark will increase his stamina for ADLs.  Long - Rush Landmark will continue to exercise regularly outside of [facility]. Short - pt will continue to exercise on days not at [place] - Pt will cotinue to exercise after completing the program. Short - Rush Landmark will continue regular attendance at [facility].  Long - Rush Landmark will graduate and exercise on his own.        Discharge Exercise Prescription (Final Exercise Prescription Changes):     Exercise Prescription Changes -  08/07/16 1500      Response to Exercise   Blood Pressure (Admit) 124/72   Blood Pressure (Exercise) 142/84   Blood Pressure (Exit) 132/76   Heart Rate (Admit) 61 bpm   Heart Rate (Exercise) 65 bpm   Heart Rate (Exit) 50 bpm   Oxygen Saturation (Admit) 98 %   Oxygen Saturation (Exercise) 97 %   Oxygen Saturation (Exit) 99 %   Rating of Perceived Exertion (Exercise) 14   Perceived Dyspnea (Exercise) 2   Duration Continue with 45 min of aerobic exercise without signs/symptoms of physical distress.   Intensity THRR unchanged     Progression   Progression Continue to progress workloads to maintain intensity without signs/symptoms of physical distress.   Average METs 3.28     Resistance Training   Training Prescription Yes   Weight 3   Reps 10-15   Time 2.29 Minutes     Interval Training   Interval Training No     Bike   Watts 50   Minutes 15   METs 3.28     Track   Laps 20   Minutes 15   METs 1.92      Nutrition:  Target Goals: Understanding of nutrition guidelines, daily intake of sodium <1534m, cholesterol <2012m calories 30% from fat and 7% or less from saturated fats, daily to have 5 or more servings of fruits and vegetables.  Biometrics:     Pre Biometrics - 06/25/16 1428      Pre Biometrics   Height 6' 2" (1.88 m)   Weight 271 lb 3.2 oz (123 kg)   Waist Circumference 52 inches   Hip Circumference 44 inches   Waist to Hip Ratio 1.18 %   BMI (Calculated) 34.9       Nutrition Therapy Plan and Nutrition Goals:     Nutrition Therapy & Goals - 08/05/16 1527      Nutrition Therapy   RD appointment defered Yes     Personal Nutrition Goals   Comments patient states his Wife is a nutritionist     Intervention Plan   Intervention Nutrition handout(s) given to patient.   Expected Outcomes Long Term Goal: Adherence to prescribed nutrition plan.;Short Term Goal: Understand basic principles of dietary content, such as calories, fat, sodium, cholesterol  and nutrients.      Nutrition Discharge: Rate Your Plate Scores:   Nutrition Goals Re-Evaluation:  Nutrition Goals Discharge (Final Nutrition Goals Re-Evaluation):   Psychosocial: Target Goals: Acknowledge presence or absence of significant depression and/or stress, maximize coping skills, provide positive support system. Participant is able to verbalize types and ability to use techniques and skills needed for reducing stress and depression.   Initial Review & Psychosocial Screening:     Initial Psych Review & Screening - 06/25/16 [place]? Yes   Comments Mr Farabee has good support from his wife and children. 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 and ICU hospitalization in June. Mr Monnier is looking forward to supervised exercise and learning more about his COPD.     Barriers   Psychosocial barriers to participate in program There are no identifiable barriers or psychosocial needs.;The patient should benefit from training in stress management and relaxation.     Screening Interventions   Interventions Encouraged to exercise      Quality of Life Scores:     Quality of Life - 06/25/16 1540      Quality of Life Scores   Health/Function Pre 20.06 %   Socioeconomic Pre 19.83 %   Psych/Spiritual Pre 20.33 %   Family Pre 21 %   GLOBAL Pre 20.19 %      PHQ-9: Recent Review Flowsheet Data    Depression screen [facility] 2/9 06/25/2016 09/26/2015 08/07/2015   Decreased Interest _0 Down, Depressed, Hopeless 0 0 0   PHQ - 2 Score _1 Altered sleeping _2 Tired, decreased energy _3 Change in appetite 1 2 0   Feeling bad or failure about yourself  0 0 0   Trouble concentrating _4 Moving slowly or fidgety/restless 0 1 0   Suicidal thoughts 0 0 0   PHQ-9 Score _5 Difficult doing work/chores Very difficult Somewhat difficult Somewhat difficult     Interpretation of Total Score  Total Score  Depression Severity:  1-4 = Minimal depression, 5-9 = Mild depression, 10-14 = Moderate depression, 15-19 = Moderately severe depression, 20-27 = Severe depression   Psychosocial Evaluation and Intervention:     Psychosocial Evaluation - 07/22/16 1113      Psychosocial Evaluation & Interventions   Comments Mr. Trueba has returned this year and now qualifies for the Pulmonary Rehab program.  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  He "coded" twice in Feb 16, 2022 according to his report and his Dr has encouraged him to come for rehabilitation.  Mr. [facility]) has a strong support system with a spouse of 24 years, (that he calls his "gift") and a daughter who lives locally.  He is also actively involved in his local church and a men's prayer group.  Rush Landmark has multiple health issues in addition to cardiac and pulmonary with sleep apnea; a bad back; a knee replaced and has had surgery on both feet.  He uses a CPAP to sleep, which is helpful and he, and states his appetite is "too good!"  Rush Landmark denies a history of depression or anxiety or current symptoms.  He admits that in 1991/02/17 he has his first bypass surgery and divorce around the same time - which resulted in some mild depression.  But he saw a psychiatrist and a pastor to help him through this.  Bill's mood is generally positive and he reports other  than his health, he has minimal stress in his life.  He has goals to lose wieght; increase his stamina and strength, and improve his shortness of breath.  Staff will be following Bill throughout the course of this program.     Expected Outcomes Rush Landmark will benefit from consistent exercise to achieve his stated goals.  He also should meet with the dietician to address his weight loss goal.  With the stress in Bill's life due to multiple health issues, attending the psychoeducational components of this program will be helpful as well to refresh his coping strategies.     Continue Psychosocial Services  Follow up  required by staff      Psychosocial Re-Evaluation:     Psychosocial Re-Evaluation    [place] Name 07/15/16 1428 08/05/16 1530           Psychosocial Re-Evaluation   Current issues with  - None Identified      Comments Mr Mcduffee has attended [facility] 3 sessions. He is still nervous about not wearing a heart monitor due to his past heart codes. He does have a strong [place] faith and has had several people praying for him which has helped the most with his anxiety of another cardiac episode.  He also worries that he is a burden to his wife, since Mr Hinz can not drive and she has her own health conditions.  Mr. Arndt seems reserved about his feelings but has been talking about his activities of his daily life. Its healthy for Mr. Broadwater to talk about his daily life to develope better communication between staff and himself.      Expected Outcomes Short: continue exercising and learning from the health lectures. Long term: in 3 months, Mr Krenn will be driving and will share responsibilities with home activities. Short Term: Continue to exercise and have a positive outlook. Long Term: Mr. Lambson would like to do more things at home such as read, play solitare, and chores.      Interventions Encouraged to attend Pulmonary Rehabilitation for the exercise;Relaxation education;Stress management education Encouraged to attend Pulmonary Rehabilitation for the exercise      Continue Psychosocial Services  Follow up required by counselor Follow up required by staff         Psychosocial Discharge (Final Psychosocial Re-Evaluation):     Psychosocial Re-Evaluation - 08/05/16 1530      Psychosocial Re-Evaluation   Current issues with None Identified   Comments Mr. Laughter seems reserved about his feelings but has been talking about his activities of his daily life. Its healthy for Mr. Biss to talk about his daily life to develope better communication between staff and  himself.   Expected Outcomes Short Term: Continue to exercise and have a positive outlook. Long Term: Mr. Brisby would like to do more things at home such as read, play solitare, and chores.   Interventions Encouraged to attend Pulmonary Rehabilitation for the exercise   Continue Psychosocial Services  Follow up required by staff      Education: Education Goals: Education classes will be provided on a weekly basis, covering required topics. Participant will state understanding/return demonstration of topics presented.  Learning Barriers/Preferences:     Learning Barriers/Preferences - 06/25/16 1517      Learning Barriers/Preferences   Learning Barriers Exercise Concerns   Learning Preferences None      Education Topics: Initial Evaluation Education: - Verbal, written and demonstration of respiratory meds, RPE/PD scales, oximetry and breathing techniques. Instruction on use of  nebulizers and MDIs: cleaning and proper use, rinsing mouth with steroid doses and importance of monitoring MDI activations.   Pulmonary Rehab from 08/14/2016 in [place] [facility] Cardiac and Pulmonary Rehab  Date  06/25/16  Educator  LB  Instruction Review Code  2- meets goals/outcomes      General Nutrition Guidelines/Fats and Fiber: -Group instruction provided by verbal, written material, models and posters to present the general guidelines for heart healthy nutrition. Gives an explanation and review of dietary fats and fiber.   Cardiac Rehab from 10/31/2015 in [facility] Cardiac and Pulmonary Rehab  Date  10/10/15  Educator  CR  Instruction Review Code  R- Review/reinforce [Second Class]      Controlling Sodium/Reading Food Labels: -Group verbal and written material supporting the discussion of sodium use in heart healthy nutrition. Review and explanation with models, verbal and written materials for utilization of the food label.   Cardiac Rehab from 10/31/2015 in [facility] Cardiac and Pulmonary Rehab  Date  08/22/15   Educator  Erlene Quan  Instruction Review Code  2- meets goals/outcomes      Exercise Physiology & Risk Factors: - Group verbal and written instruction with models to review the exercise physiology of the cardiovascular system and associated critical values. Details cardiovascular disease risk factors and the goals associated with each risk factor.   Cardiac Rehab from 10/31/2015 in [facility] Cardiac and Pulmonary Rehab  Date  10/24/15  Educator  [facility]  Instruction Review Code  2- meets goals/outcomes      Aerobic Exercise & Resistance Training: - Gives group verbal and written discussion on the health impact of inactivity. On the components of aerobic and resistive training programs and the benefits of this training and how to safely progress through these programs.   Cardiac Rehab from 10/31/2015 in [facility] Cardiac and Pulmonary Rehab  Date  10/26/15  Educator  [facility]  Instruction Review Code  2- meets goals/outcomes      Flexibility, Balance, General Exercise Guidelines: - Provides group verbal and written instruction on the benefits of flexibility and balance training programs. Provides general exercise guidelines with specific guidelines to those with heart or lung disease. Demonstration and skill practice provided.   Cardiac Rehab from 10/31/2015 in [facility] Cardiac and Pulmonary Rehab  Date  10/31/15  Educator  [facility]  Instruction Review Code  2- meets goals/outcomes      Stress Management: - Provides group verbal and written instruction about the health risks of elevated stress, cause of high stress, and healthy ways to reduce stress.   Cardiac Rehab from 10/31/2015 in [facility] Cardiac and Pulmonary Rehab  Date  09/07/15  Educator  CE  Instruction Review Code  2- meets goals/outcomes      Depression: - Provides group verbal and written instruction on the correlation between heart/lung disease and depressed mood, treatment options, and the stigmas associated with seeking treatment.   Cardiac  Rehab from 10/31/2015 in [facility] Cardiac and Pulmonary Rehab  Date  08/17/15  Educator  CE  Instruction Review Code  2- meets goals/outcomes      Exercise & Equipment Safety: - Individual verbal instruction and demonstration of equipment use and safety with use of the equipment.   Infection Prevention: - Provides verbal and written material to individual with discussion of infection control including proper hand washing and proper equipment cleaning during exercise session.   Falls Prevention: - Provides verbal and written material to individual with discussion of falls prevention and safety.   Diabetes: - Individual verbal and  written instruction to review signs/symptoms of diabetes, desired ranges of glucose level fasting, after meals and with exercise. Advice that pre and post exercise glucose checks will be done for 3 sessions at entry of program.   Chronic Lung Diseases: - Group verbal and written instruction to review new updates, new respiratory medications, new advancements in procedures and treatments. Provide informative websites and "800" numbers of self-education.   Pulmonary Rehab from 08/14/2016 in [facility] Cardiac and Pulmonary Rehab  Date  08/07/16  Educator  [facility]  Instruction Review Code  2- meets goals/outcomes      Lung Procedures: - Group verbal and written instruction to describe testing methods done to diagnose lung disease. Review the outcome of test results. Describe the treatment choices: Pulmonary Function Tests, ABGs and oximetry.   Energy Conservation: - Provide group verbal and written instruction for methods to conserve energy, plan and organize activities. Instruct on pacing techniques, use of adaptive equipment and posture/positioning to relieve shortness of breath.   Pulmonary Rehab from 08/14/2016 in [facility] Cardiac and Pulmonary Rehab  Date  07/03/16  Educator  [facility]  Instruction Review Code  2- meets goals/outcomes      Triggers: - Group verbal and  written instruction to review types of environmental controls: home humidity, furnaces, filters, dust mite/pet prevention, HEPA vacuums. To discuss weather changes, air quality and the benefits of nasal washing.   Exacerbations: - Group verbal and written instruction to provide: warning signs, infection symptoms, calling MD promptly, preventive modes, and value of vaccinations. Review: effective airway clearance, coughing and/or vibration techniques. Create an Sports administrator.   Oxygen: - Individual and group verbal and written instruction on oxygen therapy. Includes supplement oxygen, available portable oxygen systems, continuous and intermittent flow rates, oxygen safety, concentrators, and Medicare reimbursement for oxygen.   Respiratory Medications: - Group verbal and written instruction to review medications for lung disease. Drug class, frequency, complications, importance of spacers, rinsing mouth after steroid MDI's, and proper cleaning methods for nebulizers.   Pulmonary Rehab from 08/14/2016 in [facility] Cardiac and Pulmonary Rehab  Date  06/25/16  Educator  LB  Instruction Review Code  2- meets goals/outcomes      AED/CPR: - Group verbal and written instruction with the use of models to demonstrate the basic use of the AED with the basic ABC's of resuscitation.   Breathing Retraining: - Provides individuals verbal and written instruction on purpose, frequency, and proper technique of diaphragmatic breathing and pursed-lipped breathing. Applies individual practice skills.   Pulmonary Rehab from 08/14/2016 in [facility] Cardiac and Pulmonary Rehab  Date  06/25/16  Educator  LB  Instruction Review Code  2- meets goals/outcomes      Anatomy and Physiology of the Lungs: - Group verbal and written instruction with the use of models to provide basic lung anatomy and physiology related to function, structure and complications of lung disease.   Heart Failure: - Group verbal and written  instruction on the basics of heart failure: signs/symptoms, treatments, explanation of ejection fraction, enlarged heart and cardiomyopathy.   Sleep Apnea: - Individual verbal and written instruction to review Obstructive Sleep Apnea. Review of risk factors, methods for diagnosing and types of masks and machines for OSA.   Pulmonary Rehab from 08/14/2016 in [facility] Cardiac and Pulmonary Rehab  Date  06/25/16  Educator  LB  Instruction Review Code  2- meets goals/outcomes      Anxiety: - Provides group, verbal and written instruction on the correlation between heart/lung disease and anxiety, treatment  options, and management of anxiety.   Relaxation: - Provides group, verbal and written instruction about the benefits of relaxation for patients with heart/lung disease. Also provides patients with examples of relaxation techniques.   Pulmonary Rehab from 08/14/2016 in [facility] Cardiac and Pulmonary Rehab  Date  08/14/16  Educator  [facility]  Instruction Review Code  2- Meets goals/outcomes      Knowledge Questionnaire Score:     Knowledge Questionnaire Score - 06/25/16 1517      Knowledge Questionnaire Score   Pre Score 10/10       Core Components/Risk Factors/Patient Goals at Admission:     Personal Goals and Risk Factors at Admission - 06/25/16 1522      Core Components/Risk Factors/Patient Goals on Admission    Weight Management Yes;Weight Loss   Intervention Weight Management: Develop a combined nutrition and exercise program designed to reach desired caloric intake, while maintaining appropriate intake of nutrient and fiber, sodium and fats, and appropriate energy expenditure required for the weight goal.;Weight Management: Provide education and appropriate resources to help participant work on and attain dietary goals.;Weight Management/Obesity: Establish reasonable short term and long term weight goals.;Obesity: Provide education and appropriate resources to help participant work on and  attain dietary goals.   Admit Weight 271 lb 3.2 oz (123 kg)   Goal Weight: Short Term 266 lb (120.7 kg)   Goal Weight: Long Term 220 lb (99.8 kg)   Expected Outcomes Long Term: Adherence to nutrition and physical activity/exercise program aimed toward attainment of established weight goal;Short Term: Continue to assess and modify interventions until short term weight is achieved;Weight Maintenance: Understanding of the daily nutrition guidelines, which includes 25-35% calories from fat, 7% or less cal from saturated fats, less than 220m cholesterol, less than 1.5gm of sodium, & 5 or more servings of fruits and vegetables daily;Weight Loss: Understanding of general recommendations for a balanced deficit meal plan, which promotes 1-2 lb weight loss per week and includes a negative energy balance of [phone] kcal/d;Understanding recommendations for meals to include 15-35% energy as protein, 25-35% energy from fat, 35-60% energy from carbohydrates, less than 2050mof dietary cholesterol, 20-35 gm of total fiber daily;Understanding of distribution of calorie intake throughout the day with the consumption of 4-5 meals/snacks   Improve shortness of breath with ADL's Yes   Intervention Provide education, individualized exercise plan and daily activity instruction to help decrease symptoms of SOB with activities of daily living.   Expected Outcomes Short Term: Achieves a reduction of symptoms when performing activities of daily living.   Develop more efficient breathing techniques such as purse lipped breathing and diaphragmatic breathing; and practicing self-pacing with activity Yes   Intervention Provide education, demonstration and support about specific breathing techniuqes utilized for more efficient breathing. Include techniques such as pursed lipped breathing, diaphragmatic breathing and self-pacing activity.   Expected Outcomes Short Term: Participant will be able to demonstrate and use breathing  techniques as needed throughout daily activities.   Increase knowledge of respiratory medications and ability to use respiratory devices properly  Yes  Anoro E and Albuterol MDI; spacer given   Intervention Provide education and demonstration as needed of appropriate use of medications, inhalers, and oxygen therapy.   Expected Outcomes Short Term: Achieves understanding of medications use. Understands that oxygen is a medication prescribed by physician. Demonstrates appropriate use of inhaler and oxygen therapy.   Heart Failure Yes   Intervention Provide a combined exercise and nutrition program that is supplemented with education, support and  counseling about heart failure. Directed toward relieving symptoms such as shortness of breath, decreased exercise tolerance, and extremity edema.   Expected Outcomes Improve functional capacity of life;Short term: Attendance in program 2-3 days a week with increased exercise capacity. Reported lower sodium intake. Reported increased fruit and vegetable intake. Reports medication compliance.;Short term: Daily weights obtained and reported for increase. Utilizing diuretic protocols set by physician.;Long term: Adoption of self-care skills and reduction of barriers for early signs and symptoms recognition and intervention leading to self-care maintenance.   Hypertension Yes   Intervention Provide education on lifestyle modifcations including regular physical activity/exercise, weight management, moderate sodium restriction and increased consumption of fresh fruit, vegetables, and low fat dairy, alcohol moderation, and smoking cessation.;Monitor prescription use compliance.   Expected Outcomes Short Term: Continued assessment and intervention until BP is < 140/33m HG in hypertensive participants. < 130/862mHG in hypertensive participants with diabetes, heart failure or chronic kidney disease.;Long Term: Maintenance of blood pressure at goal levels.   Lipids Yes    Intervention Provide education and support for participant on nutrition & aerobic/resistive exercise along with prescribed medications to achieve LDL <7014mHDL >16m73m Expected Outcomes Short Term: Participant states understanding of desired cholesterol values and is compliant with medications prescribed. Participant is following exercise prescription and nutrition guidelines.;Long Term: Cholesterol controlled with medications as prescribed, with individualized exercise RX and with personalized nutrition plan. Value goals: LDL < 70mg51mL > 40 mg.      Core Components/Risk Factors/Patient Goals Review:      Goals and Risk Factor Review    Row Name 07/15/16 1419 08/05/16 1517           Core Components/Risk Factors/Patient Goals Review   Personal Goals Review Weight Management/Obesity;Develop more efficient breathing techniques such as purse lipped breathing and diaphragmatic breathing and practicing self-pacing with activity.;Improve shortness of breath with ADL's Weight Management/Obesity;Improve shortness of breath with ADL's;Develop more efficient breathing techniques such as purse lipped breathing and diaphragmatic breathing and practicing self-pacing with activity.      Review Mr HendeAullcompleted 3 sessions of [facility]. He is cued to do PLB and does have shortness of breath with most activity. He would like to mow at home, but with the heat and shortness of breath, he has not been able to mow, but he hopes to in the future while improving in Lung[facility]ukeis maintaining his weight at 273lbs, does not want to meet with the dietitian at this time, and states his wife cooks healthy.[facility]id [facility], even on [facility] days. Mr. HendeSilveriabeen working hard the last few sessions pushing himself. He states some days are better than others with his shortness of breath but it has not been too bad.      Expected Outcomes Long term: Continue to  attend [facility] to increase his stamina and conditioning to improve his home exercise; Short: Learn to use PLB without cueing. Long term: Continue to attend [facility] to increase his strength to improve [facility]: Learn to use PLB without cueing. Lose weight for easier breathing.         Core Components/Risk Factors/Patient Goals at Discharge (Final Review):      Goals and Risk Factor Review - 08/05/16 1517      Core Components/Risk Factors/Patient Goals Review   Personal Goals Review Weight Management/Obesity;Improve shortness of breath with ADL's;Develop more efficient breathing techniques such  as purse lipped breathing and diaphragmatic breathing and practicing self-pacing with activity.   Review Mr. Domangue has been working hard the last few sessions pushing himself. He states some days are better than others with his shortness of breath but it has not been too bad.   Expected Outcomes Long term: Continue to attend [facility] to increase his strength to improve his home exercise routine at the [facility] Short: Learn to use PLB without cueing. Lose weight for easier breathing.      ITP Comments:     ITP Comments    Row Name 07/10/16 1306 07/22/16 0741 08/02/16 1345 08/19/16 0851     ITP Comments Rush Landmark has been absent due to other Dr appointments. 30 day note review with Medical Director of [place] Dr. Emily Filbert  Reviewed home exercise with pt today.  Pt plans to walk  for exercise.  Reviewed THR, pulse, RPE, sign and symptoms, NTG use, and when to call 911 or MD.  Also discussed weather considerations and indoor options.  Pt voiced understanding. 30 day review completed ITP sent to Dr. Ramonita Lab for Dr. Emily Filbert Director of [place]. Continue with ITP unless changes are made by physician.        Comments:

## 2016-08-19 NOTE — Progress Notes (Signed)
Daily Session Note  Patient Details  Name: Daniell Mancinas. MRN: [id number] Date of Birth: [birth year] Referring Provider:     Pulmonary Rehab from 06/25/2016 in [facility] Cardiac and Pulmonary Rehab  Referring Provider  Ramonita Lab MD      Encounter Date: 08/19/2016  Check In:     Session Check In - 08/19/16 1011      Check-In   Location [facility]-Cardiac & Pulmonary Rehab   Staff Present Justin Mend RCP,RRT,BSRT;Amanda Oletta Darter, BA, ACSM CEP, Exercise Physiologist;Niesha Bame Amedeo Plenty, BS, ACSM CEP, Exercise Physiologist   Supervising physician immediately available to respond to emergencies [facility] immediately available ER MD   Physician(s) Dr. Shirl Harris and Dr. Quentin Cornwall   Medication changes reported     No   Fall or balance concerns reported    No   Warm-up and Cool-down Performed as group-led instruction   Resistance Training Performed Yes   VAD Patient? No     Pain Assessment   Currently in Pain? No/denies   Multiple Pain Sites No         History  Smoking Status  . Former Smoker  . Packs/day: 1.00  . Years: 33.00  . Types: Cigarettes  . Quit date: 10/03/1991  Smokeless Tobacco  . Never Used    Goals Met:  Proper associated with RPD/PD & O2 Sat Independence with exercise equipment Exercise tolerated well Strength training completed today  Goals Unmet:  Not Applicable  Comments: Pt able to follow exercise prescription today without complaint.  Will continue to monitor for progression.    Dr. Emily Filbert is Medical Director for [place] and [facility] Pulmonary Rehabilitation.

## 2016-08-21 ENCOUNTER — Encounter: Payer: Medicare HMO | Attending: Internal Medicine

## 2016-08-21 DIAGNOSIS — J449 Chronic obstructive pulmonary disease, unspecified: Secondary | ICD-10-CM | POA: Insufficient documentation

## 2016-08-26 ENCOUNTER — Encounter: Payer: Medicare HMO | Admitting: *Deleted

## 2016-08-26 DIAGNOSIS — J449 Chronic obstructive pulmonary disease, unspecified: Secondary | ICD-10-CM | POA: Diagnosis present

## 2016-08-26 DIAGNOSIS — I208 Other forms of angina pectoris: Secondary | ICD-10-CM

## 2016-08-26 NOTE — Progress Notes (Signed)
Daily Session Note  Patient Details  Name: Bradley Barnes. MRN: [id number] Date of Birth: 1940-12-10 Referring Provider:     Pulmonary Rehab from 06/25/2016 in [facility] Cardiac and Pulmonary Rehab  Referring Provider  Ramonita Lab MD      Encounter Date: 08/26/2016  Check In:     Session Check In - 08/26/16 1014      Check-In   Location [facility]-Cardiac & Pulmonary Rehab   Staff Present Justin Mend RCP,RRT,BSRT;Laureen Owens Shark, BS, RRT, Respiratory Bertis Ruddy, BS, ACSM CEP, Exercise Physiologist   Supervising physician immediately available to respond to emergencies [facility] immediately available ER MD   Physician(s) Dr. Kerman Passey and Dr. Jimmye Norman    Medication changes reported     No   Fall or balance concerns reported    No   Warm-up and Cool-down Performed as group-led instruction   Resistance Training Performed Yes   VAD Patient? No     Pain Assessment   Currently in Pain? No/denies   Multiple Pain Sites No         History  Smoking Status  . Former Smoker  . Packs/day: 1.00  . Years: 33.00  . Types: Cigarettes  . Quit date: 10/03/1991  Smokeless Tobacco  . Never Used    Goals Met:  Proper associated with RPD/PD & O2 Sat Independence with exercise equipment Exercise tolerated well Strength training completed today  Goals Unmet:  Not Applicable  Comments: Pt able to follow exercise prescription today without complaint.  Will continue to monitor for progression.    Dr. Emily Filbert is Medical Director for [place] and [facility] Pulmonary Rehabilitation.

## 2016-08-28 ENCOUNTER — Encounter: Payer: Medicare HMO | Admitting: *Deleted

## 2016-08-28 DIAGNOSIS — J449 Chronic obstructive pulmonary disease, unspecified: Secondary | ICD-10-CM

## 2016-08-28 NOTE — Progress Notes (Signed)
Daily Session Note  Patient Details  Name: Bradley Barnes. MRN: [id number] Date of Birth: 1940/10/17 Referring Provider:     Pulmonary Rehab from 06/25/2016 in [facility] Cardiac and Pulmonary Rehab  Referring Provider  Ramonita Lab MD      Encounter Date: 08/28/2016  Check In:     Session Check In - 08/28/16 1111      Check-In   Staff Present Heath Lark, RN, BSN, CCRP;Jessica Luan Pulling, MA, ACSM RCEP, Exercise Physiologist;Joseph Flavia Shipper   Supervising physician immediately available to respond to emergencies [facility] immediately available ER MD   Physician(s) Drs: Jimmye Norman and Archie Balboa   Medication changes reported     No   Fall or balance concerns reported    No   Warm-up and Cool-down Performed as group-led instruction   Resistance Training Performed Yes   VAD Patient? No     Pain Assessment   Currently in Pain? No/denies         History  Smoking Status  . Former Smoker  . Packs/day: 1.00  . Years: 33.00  . Types: Cigarettes  . Quit date: 10/03/1991  Smokeless Tobacco  . Never Used    Goals Met:  Proper associated with RPD/PD & O2 Sat Independence with exercise equipment Exercise tolerated well Strength training completed today  Goals Unmet:  Not Applicable  Comments: Doing well with exercise prescription progression.    Dr. Emily Filbert is Medical Director for [place] and [facility] Pulmonary Rehabilitation.

## 2016-09-02 ENCOUNTER — Encounter: Payer: Medicare HMO | Admitting: *Deleted

## 2016-09-02 DIAGNOSIS — I208 Other forms of angina pectoris: Secondary | ICD-10-CM

## 2016-09-02 DIAGNOSIS — J449 Chronic obstructive pulmonary disease, unspecified: Secondary | ICD-10-CM | POA: Diagnosis not present

## 2016-09-02 NOTE — Progress Notes (Signed)
Daily Session Note  Patient Details  Name: Bradley Barnes. MRN: [id number] Date of Birth: 1940-04-15 Referring Provider:     Pulmonary Rehab from 06/25/2016 in [facility] Cardiac and Pulmonary Rehab  Referring Provider  Ramonita Lab MD      Encounter Date: 09/02/2016  Check In:     Session Check In - 09/02/16 1012      Check-In   Location [facility]-Cardiac & Pulmonary Rehab   Staff Present Earlean Shawl, BS, ACSM CEP, Exercise Physiologist;Amanda Oletta Darter, BA, ACSM CEP, Exercise Physiologist;Joseph Flavia Shipper   Supervising physician immediately available to respond to emergencies [facility] immediately available ER MD   Physician(s) Dr. Mosie Lukes and Dr. Alfred Levins   Medication changes reported     No   Fall or balance concerns reported    No   Warm-up and Cool-down Performed as group-led instruction   Resistance Training Performed Yes   VAD Patient? No     Pain Assessment   Currently in Pain? No/denies   Multiple Pain Sites No         History  Smoking Status  . Former Smoker  . Packs/day: 1.00  . Years: 33.00  . Types: Cigarettes  . Quit date: 10/03/1991  Smokeless Tobacco  . Never Used    Goals Met:  Proper associated with RPD/PD & O2 Sat Independence with exercise equipment Exercise tolerated well Strength training completed today  Goals Unmet:  Not Applicable  Comments: Pt able to follow exercise prescription today without complaint.  Will continue to monitor for progression.    Dr. Emily Filbert is Medical Director for [place] and [facility] Pulmonary Rehabilitation.

## 2016-09-04 ENCOUNTER — Encounter: Payer: Medicare HMO | Admitting: *Deleted

## 2016-09-04 DIAGNOSIS — J449 Chronic obstructive pulmonary disease, unspecified: Secondary | ICD-10-CM | POA: Diagnosis not present

## 2016-09-04 NOTE — Progress Notes (Signed)
Daily Session Note  Patient Details  Name: Bradley Barnes. MRN: [id number] Date of Birth: 1940-06-11 Referring Provider:     Pulmonary Rehab from 06/25/2016 in [facility] Cardiac and Pulmonary Rehab  Referring Provider  Ramonita Lab MD      Encounter Date: 09/04/2016  Check In:     Session Check In - 09/04/16 1009      Check-In   Location [facility]-Cardiac & Pulmonary Rehab   Staff Present Alberteen Sam, MA, ACSM RCEP, Exercise Physiologist;Amanda Oletta Darter, BA, ACSM CEP, Exercise Physiologist;Tameaka Eichhorn Flavia Shipper   Supervising physician immediately available to respond to emergencies [facility] immediately available ER MD   Physician(s) Drs. Quentin Cornwall and [place]   Medication changes reported     No   Fall or balance concerns reported    No   Warm-up and Cool-down Performed as group-led Location manager Performed Yes   VAD Patient? No     Pain Assessment   Currently in Pain? No/denies   Multiple Pain Sites No         History  Smoking Status  . Former Smoker  . Packs/day: 1.00  . Years: 33.00  . Types: Cigarettes  . Quit date: 10/03/1991  Smokeless Tobacco  . Never Used    Goals Met:  Proper associated with RPD/PD & O2 Sat Independence with exercise equipment Exercise tolerated well No report of cardiac concerns or symptoms Strength training completed today  Goals Unmet:  Not Applicable  Comments: Pt able to follow exercise prescription today without complaint.  Will continue to monitor for progression.   Dr. Emily Filbert is Medical Director for [place] and [facility].

## 2016-09-09 DIAGNOSIS — J449 Chronic obstructive pulmonary disease, unspecified: Secondary | ICD-10-CM | POA: Diagnosis not present

## 2016-09-09 NOTE — Progress Notes (Signed)
Daily Session Note  Patient Details  Name: Derryl Uher. MRN: [id number] Date of Birth: 22-Oct-1940 Referring Provider:     Pulmonary Rehab from 06/25/2016 in [facility] Cardiac and Pulmonary Rehab  Referring Provider  Ramonita Lab MD      Encounter Date: 09/09/2016  Check In:     Session Check In - 09/09/16 1031      Check-In   Location [facility]-Cardiac & Pulmonary Rehab   Staff Present Gerlene Burdock, RN, Moises Blood, BS, ACSM CEP, Exercise Physiologist;Christien Frankl Flavia Shipper   Supervising physician immediately available to respond to emergencies [facility] immediately available ER MD   Physician(s) Dr. Mable Paris and Jimmye Norman   Medication changes reported     No   Fall or balance concerns reported    No   Warm-up and Cool-down Performed as group-led instruction   Resistance Training Performed Yes   VAD Patient? No     Pain Assessment   Currently in Pain? No/denies   Multiple Pain Sites No         History  Smoking Status  . Former Smoker  . Packs/day: 1.00  . Years: 33.00  . Types: Cigarettes  . Quit date: 10/03/1991  Smokeless Tobacco  . Never Used    Goals Met:  Proper associated with RPD/PD & O2 Sat Independence with exercise equipment Exercise tolerated well No report of cardiac concerns or symptoms Strength training completed today  Goals Unmet:  Not Applicable  Comments: Pt able to follow exercise prescription today without complaint.  Will continue to monitor for progression.   Dr. Emily Filbert is Medical Director for [place] and [facility].

## 2016-09-11 DIAGNOSIS — J449 Chronic obstructive pulmonary disease, unspecified: Secondary | ICD-10-CM | POA: Diagnosis not present

## 2016-09-11 NOTE — Progress Notes (Signed)
Daily Session Note  Patient Details  Name: Bradley Barnes. MRN: [id number] Date of Birth: Mar 18, 1940 Referring Provider:     Pulmonary Rehab from 06/25/2016 in [facility] Cardiac and Pulmonary Rehab  Referring Provider  Ramonita Lab MD      Encounter Date: 09/11/2016  Check In:     Session Check In - 09/11/16 1022      Check-In   Location [facility]-Cardiac & Pulmonary Rehab   Staff Present Alberteen Sam, MA, ACSM RCEP, Exercise Physiologist;Amanda Oletta Darter, BA, ACSM CEP, Exercise Physiologist;Everrett Lacasse Flavia Shipper   Supervising physician immediately available to respond to emergencies [facility] immediately available ER MD   Physician(s) Dr. Jimmye Norman and Mariea Clonts   Medication changes reported     No   Fall or balance concerns reported    No   Warm-up and Cool-down Performed as group-led instruction   Resistance Training Performed Yes   VAD Patient? No     Pain Assessment   Currently in Pain? No/denies   Multiple Pain Sites No         History  Smoking Status  . Former Smoker  . Packs/day: 1.00  . Years: 33.00  . Types: Cigarettes  . Quit date: 10/03/1991  Smokeless Tobacco  . Never Used    Goals Met:  Proper associated with RPD/PD & O2 Sat Independence with exercise equipment Exercise tolerated well No report of cardiac concerns or symptoms Strength training completed today  Goals Unmet:  Not Applicable  Comments: Pt able to follow exercise prescription today without complaint.  Will continue to monitor for progression.   Dr. Emily Filbert is Medical Director for [place] and [facility].

## 2016-09-16 ENCOUNTER — Encounter: Payer: Medicare HMO | Admitting: *Deleted

## 2016-09-16 DIAGNOSIS — I208 Other forms of angina pectoris: Secondary | ICD-10-CM

## 2016-09-16 DIAGNOSIS — J449 Chronic obstructive pulmonary disease, unspecified: Secondary | ICD-10-CM

## 2016-09-16 DIAGNOSIS — I2089 Other forms of angina pectoris: Secondary | ICD-10-CM

## 2016-09-16 NOTE — Progress Notes (Signed)
Daily Session Note  Patient Details  Name: Bradley Barnes. MRN: [id number] Date of Birth: May 19, 1940 Referring Provider:     Pulmonary Rehab from 06/25/2016 in [facility] Cardiac and Pulmonary Rehab  Referring Provider  Ramonita Lab MD      Encounter Date: 09/16/2016  Check In:     Session Check In - 09/16/16 1014      Check-In   Location [facility]-Cardiac & Pulmonary Rehab   Staff Present Nada Maclachlan, BA, ACSM CEP, Exercise Physiologist;Aliyanah Rozas Amedeo Plenty, BS, ACSM CEP, Exercise Physiologist;Joseph Flavia Shipper   Supervising physician immediately available to respond to emergencies [facility] immediately available ER MD   Physician(s) Dr. Mable Paris and Dr. Jimmye Norman   Medication changes reported     No   Fall or balance concerns reported    No   Warm-up and Cool-down Performed as group-led instruction   Resistance Training Performed Yes   VAD Patient? No     Pain Assessment   Currently in Pain? No/denies   Multiple Pain Sites No         History  Smoking Status  . Former Smoker  . Packs/day: 1.00  . Years: 33.00  . Types: Cigarettes  . Quit date: 10/03/1991  Smokeless Tobacco  . Never Used    Goals Met:  Proper associated with RPD/PD & O2 Sat Independence with exercise equipment Exercise tolerated well Strength training completed today  Goals Unmet:  Not Applicable  Comments: Pt able to follow exercise prescription today without complaint.  Will continue to monitor for progression.    Dr. Emily Filbert is Medical Director for [place] and [facility].

## 2016-09-16 NOTE — Progress Notes (Signed)
Pulmonary Individual Treatment Plan  Patient Details  Name: Bradley Barnes. MRN: [id number] Date of Birth: May 10, 1940 Referring Provider:     Pulmonary Rehab from 06/25/2016 in [facility]  Referring Provider  Ramonita Lab MD      Initial Encounter Date:    Pulmonary Rehab from 06/25/2016 in [facility] Pulmonary Rehab  Date  06/25/16  Referring Provider  Ramonita Lab MD      Visit Diagnosis: Chronic obstructive pulmonary disease, unspecified COPD type ([place])  Patient's Home Medications on Admission:  Current Outpatient Prescriptions:  .  Acetaminophen 500 MG coapsule, Take by mouth., Disp: , Rfl:  .  albuterol (PROAIR HFA) 108 (90 Base) MCG/ACT inhaler, Inhale into the lungs., Disp: , Rfl:  .  Alum Hydroxide-Mag Carbonate (GAVISCON EXTRA RELIEF FORMULA PO), Take by mouth., Disp: , Rfl:  .  amLODipine (NORVASC) 10 MG tablet, Take by mouth., Disp: , Rfl:  .  aspirin EC 81 MG tablet, Take by mouth., Disp: , Rfl:  .  atorvastatin (LIPITOR) 40 MG tablet, Take by mouth., Disp: , Rfl:  .  furosemide (LASIX) 40 MG tablet, Take 40 mg by mouth., Disp: , Rfl:  .  Glycopyrrolate-Formoterol (BEVESPI AEROSPHERE) 9-4.8 MCG/ACT AERO, Inhale 2 puffs into the lungs 2 (two) times daily., Disp: 10.7 g, Rfl: 5 .  Investigational - Study Medication, Take by mouth. [facility] IDS, Disp: , Rfl:  .  isosorbide mononitrate (IMDUR) 60 MG 24 hr tablet, Take by mouth., Disp: , Rfl:  .  metoprolol (TOPROL-XL) 200 MG 24 hr tablet, Take by mouth., Disp: , Rfl:  .  mexiletine (MEXITIL) 200 MG capsule, Take 200 mg by mouth 3 (three) times daily., Disp: , Rfl:  .  Multiple Vitamin (MULTI-VITAMINS) TABS, Take by mouth., Disp: , Rfl:  .  nitroGLYCERIN (NITROSTAT) 0.4 MG SL tablet, Place under the tongue., Disp: , Rfl:  .  Omega-3 1000 MG CAPS, Take by mouth., Disp: , Rfl:  .  ramipril (ALTACE) 10 MG capsule, Take by mouth., Disp: , Rfl:  .  ranitidine (ZANTAC) 300 MG tablet, Take by  mouth., Disp: , Rfl:  .  sodium chloride (OCEAN) 0.65 % nasal spray, Place 1 spray into the nose as needed for congestion., Disp: , Rfl:  .  sodium chloride (V-R NASAL SPRAY SALINE) 0.65 % nasal spray, , Disp: , Rfl:  .  tamsulosin (FLOMAX) 0.4 MG CAPS capsule, Take 0.4 mg by mouth., Disp: , Rfl:  .  tiotropium (SPIRIVA) 18 MCG inhalation capsule, Place into inhaler and inhale., Disp: , Rfl:  .  Tiotropium Bromide Monohydrate (SPIRIVA RESPIMAT) 2.5 MCG/ACT AERS, Inhale 2 puffs into the lungs daily., Disp: 1 Inhaler, Rfl: 0 .  UNABLE TO FIND, Place 1 drop into both eyes daily. Med Name: Dextran 70, Disp: , Rfl:   Past Medical History: Past Medical History:  Diagnosis Date  . Chronic kidney disease   . COPD (chronic obstructive pulmonary disease) ([place])   . Hyperlipidemia   . Hypertension   . Myocardial infarction   . Sleep apnea     Tobacco Use: History  Smoking Status  . Former Smoker  . Packs/day: 1.00  . Years: 33.00  . Types: Cigarettes  . Quit date: 10/03/1991  Smokeless Tobacco  . Never Used    Labs: Recent Review Flowsheet Data    There is no flowsheet data to display.       Pulmonary Assessment Scores:     Pulmonary Assessment Scores  [place] Name 06/25/16 1519 08/12/16 1354       ADL UCSD   ADL Phase Entry Mid    SOB Score total 52 47    Rest 0 1    Walk 3 2    Stairs 4 5    Bath 2 1    Dress 2 0    Shop 4 4      mMRC Score   mMRC Score 2  -       Pulmonary Function Assessment:     Pulmonary Function Assessment - 06/25/16 1517      Pulmonary Function Tests   RV% 84 %   DLCO% 50 %     Initial Spirometry Results   FVC% 62 %   FEV1% 55 %   FEV1/FVC Ratio 64   Comments Test date 09/20/15     Post Bronchodilator Spirometry Results   FVC% 60 %   FEV1% 57 %   FEV1/FVC Ratio 68     Breath   Bilateral Breath Sounds Clear   Shortness of Breath Yes;Fear of Shortness of Breath;Limiting activity      Exercise Target Goals:    Exercise  Program Goal: Individual exercise prescription set with THRR, safety & activity barriers. Participant demonstrates ability to understand and report RPE using BORG scale, to self-measure pulse accurately, and to acknowledge the importance of the exercise prescription.  Exercise Prescription Goal: Starting with aerobic activity 30 plus minutes a day, 3 days per week for initial exercise prescription. Provide home exercise prescription and guidelines that participant acknowledges understanding prior to discharge.  Activity Barriers & Risk Stratification:     Activity Barriers & Cardiac Risk Stratification - 06/25/16 1426      Activity Barriers & Cardiac Risk Stratification   Activity Barriers Arthritis;Chest Pain/Angina;Joint Problems;Deconditioning;Muscular Weakness;Right Hip Replacement;Left Knee Replacement;Back Problems;Assistive Device;Shortness of Breath;Decreased Ventricular Function;Balance Concerns  uses rolling walker for support and balance   Cardiac Risk Stratification High      6 Minute Walk:     6 Minute Walk    Row Name 06/25/16 1421         6 Minute Walk   Phase Initial     Distance 730 feet     Walk Time 6 minutes     # of Rest Breaks 0     MPH 1.38     METS 0     RPE 16     Perceived Dyspnea  3     VO2 Peak 3.34     Symptoms Yes (comment)     Comments short of breath, using rolling walker     Resting HR 57 bpm     Resting BP 134/64     Max Ex. HR 72 bpm     Max Ex. BP 144/74     2 Minute Post BP 126/70       Interval HR   Baseline HR (retired) 57     4 Minute HR 72     5 Minute HR 72     6 Minute HR 72     2 Minute Post HR 56     Interval Heart Rate? Yes       Interval Oxygen   Interval Oxygen? Yes     Baseline Oxygen Saturation % 98 %     Resting Liters of Oxygen 0 L  Room Air     1 Minute Liters of Oxygen 0 L     2 Minute Liters of Oxygen 0 L  3 Minute Oxygen Saturation % 90 %     3 Minute Liters of Oxygen 0 L     4 Minute Oxygen  Saturation % 95 %     4 Minute Liters of Oxygen 0 L     5 Minute Oxygen Saturation % 99 %     5 Minute Liters of Oxygen 0 L     6 Minute Oxygen Saturation % 99 %     6 Minute Liters of Oxygen 0 L     2 Minute Post Oxygen Saturation % 100 %     2 Minute Post Liters of Oxygen 0 L       Oxygen Initial Assessment:     Oxygen Initial Assessment - 06/25/16 1522      Home Oxygen   Home Oxygen Device None      Oxygen Re-Evaluation:     Oxygen Re-Evaluation    Row Name 07/15/16 1419 08/05/16 1522 08/29/16 1243         Program Oxygen Prescription   Program Oxygen Prescription None None None       Home Oxygen   Home Oxygen Device None None None     Sleep Oxygen Prescription  - None None     Home Exercise Oxygen Prescription  - None None     Home at Rest Exercise Oxygen Prescription  - None None       Goals/Expected Outcomes   Short Term Goals  - To learn and understand importance of monitoring SPO2 with pulse oximeter and demonstrate accurate use of the pulse oximeter.;To Learn and understand importance of maintaining oxygen saturations>88%;To learn and demonstrate proper purse lipped breathing techniques or other breathing techniques.;To learn and demonstrate proper use of respiratory medications To learn and understand importance of monitoring SPO2 with pulse oximeter and demonstrate accurate use of the pulse oximeter.;To Learn and understand importance of maintaining oxygen saturations>88%;To learn and demonstrate proper purse lipped breathing techniques or other breathing techniques.;To learn and demonstrate proper use of respiratory medications     Long  Term Goals  - Exhibits compliance with exercise, home and travel O2 prescription;Maintenance of O2 saturations>88%;Compliance with respiratory medication;Verbalizes importance of monitoring SPO2 with pulse oximeter and return demonstration;Exhibits proper breathing techniques, such as purse lipped breathing or other method  taught during program session;Demonstrates proper use of MDI's Compliance with respiratory medication;Maintenance of O2 saturations>88%;Exhibits proper breathing techniques, such as purse lipped breathing or other method taught during program session;Demonstrates proper use of MDI's;Verbalizes importance of monitoring SPO2 with pulse oximeter and return demonstration     Comments  - Patient has been doing well with his O2 saturations since he has been in the program. Patient feels like he needs to work on losing weight. Bill's O2 saturation has not dropped below 90% while in the program. He is checking his oxygen at home with his pulse ox regularly. He is taking his repiratory medications with a spacer. Rush Landmark states he still wears his CPAP everytime he sleeps.     Goals/Expected Outcomes  - Short term: Understand when to check and the importance of monitoring O2 saturation. Long Term: use proper breathing techniques and monitor his own O2 saturation.  Short: continue to work on proper breathing techniques. Long. Be proficient in PLB techniques.        Oxygen Discharge (Final Oxygen Re-Evaluation):     Oxygen Re-Evaluation - 08/29/16 1243      Program Oxygen Prescription   Program Oxygen Prescription None  Home Oxygen   Home Oxygen Device None   Sleep Oxygen Prescription None   Home Exercise Oxygen Prescription None   Home at Rest Exercise Oxygen Prescription None     Goals/Expected Outcomes   Short Term Goals To learn and understand importance of monitoring SPO2 with pulse oximeter and demonstrate accurate use of the pulse oximeter.;To Learn and understand importance of maintaining oxygen saturations>88%;To learn and demonstrate proper purse lipped breathing techniques or other breathing techniques.;To learn and demonstrate proper use of respiratory medications   Long  Term Goals Compliance with respiratory medication;Maintenance of O2 saturations>88%;Exhibits proper breathing techniques,  such as purse lipped breathing or other method taught during program session;Demonstrates proper use of MDI's;Verbalizes importance of monitoring SPO2 with pulse oximeter and return demonstration   Comments Bill's O2 saturation has not dropped below 90% while in the program. He is checking his oxygen at home with his pulse ox regularly. He is taking his repiratory medications with a spacer. Rush Landmark states he still wears his CPAP everytime he sleeps.   Goals/Expected Outcomes Short: continue to work on proper breathing techniques. Long. Be proficient in PLB techniques.      Initial Exercise Prescription:     Initial Exercise Prescription - 06/25/16 1400      Date of Initial Exercise RX and Referring Provider   Date 06/25/16   Referring Provider Ramonita Lab MD     Treadmill   MPH 1.1   Grade 0   Minutes 15   METs 1.84     Bike   Level 0.5   Watts 10   Minutes 15   METs 1.5     T5 Nustep   Level 1   SPM 80   Minutes 15   METs 1.5     Track   Laps 15   Minutes 15   METs 1.7     Prescription Details   Frequency (times per week) 3   Duration Progress to 45 minutes of aerobic exercise without signs/symptoms of physical distress     Intensity   THRR 40-80% of Max Heartrate 91-127   Ratings of Perceived Exertion 11-13   Perceived Dyspnea 0-4     Progression   Progression Continue to progress workloads to maintain intensity without signs/symptoms of physical distress.     Resistance Training   Training Prescription Yes   Weight 3 lbs   Reps 10-15      Perform Capillary Blood Glucose checks as needed.  Exercise Prescription Changes:     Exercise Prescription Changes    Row Name 07/03/16 1200 07/23/16 1300 08/02/16 1300 08/07/16 1500 08/23/16 0900     Response to Exercise   Blood Pressure (Admit) 102/64 112/62  - 124/72 126/68   Blood Pressure (Exercise) 146/64 120/70  - 142/84 142/70   Blood Pressure (Exit) 104/58 102/60  - 132/76 132/74   Heart Rate (Admit) 56  bpm 57 bpm  - 61 bpm 54 bpm   Heart Rate (Exercise) 64 bpm 60 bpm  - 65 bpm 64 bpm   Heart Rate (Exit) 54 bpm 50 bpm  - 50 bpm 54 bpm   Oxygen Saturation (Admit) 100 % 98 %  - 98 % 98 %   Oxygen Saturation (Exercise) 99 % 198 %  - 97 % 97 %   Oxygen Saturation (Exit) 100 % 98 %  - 99 % 98 %   Rating of Perceived Exertion (Exercise) 14 14  - 14 14   Perceived Dyspnea (Exercise) 2 2.5  -  2 2   Duration Progress to 45 minutes of aerobic exercise without signs/symptoms of physical distress Progress to 45 minutes of aerobic exercise without signs/symptoms of physical distress  - Continue with 45 min of aerobic exercise without signs/symptoms of physical distress. Continue with 45 min of aerobic exercise without signs/symptoms of physical distress.   Intensity THRR unchanged THRR unchanged  - THRR unchanged Other (comment)  PM and meds keep Bill from attaining THR     Progression   Progression Continue to progress workloads to maintain intensity without signs/symptoms of physical distress. Continue to progress workloads to maintain intensity without signs/symptoms of physical distress.  - Continue to progress workloads to maintain intensity without signs/symptoms of physical distress. Continue to progress workloads to maintain intensity without signs/symptoms of physical distress.   Average METs  - 2.38  - 3.28 1.8     Resistance Training   Training Prescription Yes Yes  - Yes Yes   Weight 4 3  - 3 4   Reps 10-15 10-15  - 10-15 10-15   Time  -  -  - 2.29 Minutes  -     Interval Training   Interval Training  - No  - No No     Oxygen   Liters  - 0  -  -  -     Bike   Level 0.5  -  -  -  -   Watts 10 31  - 50 50   Minutes 15 15  - 15 15   METs  - 2.76  - 3.28 3.28     T5 Nustep   Level 1 2  -  - 3   SPM 103 95  -  - 74   Minutes 15 15  -  - 15   METs 2.1 2  -  - 1.9     Track   Laps  -  -  - 20 17   Minutes  -  -  - 15 15   METs  -  -  - 1.92 1.77     Home Exercise Plan   Plans  to continue exercise at  -  - [employer] (comment)  walk at [employer]  -  -   Frequency  -  - Add 2 additional days to program exercise sessions.  -  -   Initial Home Exercises Provided  -  - 08/02/16  -  -   [place] Name 09/04/16 1200             Response to Exercise   Blood Pressure (Admit) 125/56       Blood Pressure (Exit) 138/62       Heart Rate (Admit) 54 bpm       Heart Rate (Exercise) 61 bpm       Heart Rate (Exit) 58 bpm       Oxygen Saturation (Admit) 97 %       Oxygen Saturation (Exercise) 98 %       Oxygen Saturation (Exit) 97 %       Rating of Perceived Exertion (Exercise) 14       Perceived Dyspnea (Exercise) 2       Duration Continue with 45 min of aerobic exercise without signs/symptoms of physical distress.       Intensity Other (comment)  pacemaker         Progression   Progression Continue to progress workloads to maintain intensity without signs/symptoms of physical  distress.       Average METs 2.75         Resistance Training   Training Prescription Yes       Weight 4       Reps 10-15         Interval Training   Interval Training No         Bike   Watts 58       Minutes 15       METs 3.57         Track   Laps 20       Minutes 15       METs 1.92          Exercise Comments:     Exercise Comments    Row Name 07/03/16 1247           Exercise Comments First full day of exercise!  Patient was oriented to gym and equipment including functions, settings, policies, and procedures.  Patient's individual exercise prescription and treatment plan were reviewed.  All starting workloads were established based on the results of the 6 minute walk test done at initial orientation visit.  The plan for exercise progression was also introduced and progression will be customized based on patient's performance and goals.          Exercise Goals and Review:     Exercise Goals    Row Name 06/25/16 1428             Exercise Goals    Increase Physical Activity Yes       Intervention Provide advice, education, support and counseling about physical activity/exercise needs.;Develop an individualized exercise prescription for aerobic and resistive training based on initial evaluation findings, risk stratification, comorbidities and participant's personal goals.       Expected Outcomes Achievement of increased cardiorespiratory fitness and enhanced flexibility, muscular endurance and strength shown through measurements of functional capacity and personal statement of participant.       Increase Strength and Stamina Yes       Intervention Provide advice, education, support and counseling about physical activity/exercise needs.;Develop an individualized exercise prescription for aerobic and resistive training based on initial evaluation findings, risk stratification, comorbidities and participant's personal goals.       Expected Outcomes Achievement of increased cardiorespiratory fitness and enhanced flexibility, muscular endurance and strength shown through measurements of functional capacity and personal statement of participant.          Exercise Goals Re-Evaluation :     Exercise Goals Re-Evaluation    Row Name 07/23/16 1319 08/02/16 1346 08/07/16 1517 08/23/16 0936 09/04/16 1237     Exercise Goal Re-Evaluation   Exercise Goals Review Increase Physical Activity;Increase Strenth and Stamina Increase Physical Activity;Increase Strenth and Stamina Increase Physical Activity;Increase Strenth and Stamina Increase Physical Activity;Increase Strenth and Stamina Increase Physical Activity;Increase Strenth and Stamina   Comments Rush Landmark is tolerating exercise well so far.    Reviewed home exercise with pt today.  Pt plans to walk  for exercise.  Reviewed THR, pulse, RPE, sign and symptoms, NTG use, and when to call 911 or MD.  Also discussed weather considerations and indoor options.  Pt voiced understanding. Rush Landmark is increasing his MET level  overall.  He is walking at the [facility] on days he doesnt attend  LW. Bill's pacemaker and medications keep him from reaching his target HR.  His RPE indicates he is working at a proper level. Rush Landmark has inceased his overall  MET level slowly.  He is also walking on days he doesnt attend LW.   Expected Outcomes Short- Rush Landmark will increase his stamina for ADLs.  Long - Rush Landmark will continue to exercise regularly outside of LW. Short - pt will continue to exercise on days not at [place] - Pt will cotinue to exercise after completing the program. Short - Rush Landmark will continue regular attendance at [facility].  Long - Rush Landmark will graduate and exercise on his own. Short - Rush Landmark will continue to exercise in LW.  Long - Rush Landmark will complete LW and exercise on his own. Short - Rush Landmark will continue to exercise outside LW class.  Long - Rush Landmark will continue to exercise on his own after completing LW.      Discharge Exercise Prescription (Final Exercise Prescription Changes):     Exercise Prescription Changes - 09/04/16 1200      Response to Exercise   Blood Pressure (Admit) 125/56   Blood Pressure (Exit) 138/62   Heart Rate (Admit) 54 bpm   Heart Rate (Exercise) 61 bpm   Heart Rate (Exit) 58 bpm   Oxygen Saturation (Admit) 97 %   Oxygen Saturation (Exercise) 98 %   Oxygen Saturation (Exit) 97 %   Rating of Perceived Exertion (Exercise) 14   Perceived Dyspnea (Exercise) 2   Duration Continue with 45 min of aerobic exercise without signs/symptoms of physical distress.   Intensity Other (comment)  pacemaker     Progression   Progression Continue to progress workloads to maintain intensity without signs/symptoms of physical distress.   Average METs 2.75     Resistance Training   Training Prescription Yes   Weight 4   Reps 10-15     Interval Training   Interval Training No     Bike   Watts 58   Minutes 15   METs 3.57     Track   Laps 20   Minutes 15   METs 1.92      Nutrition:  Target Goals:  Understanding of nutrition guidelines, daily intake of sodium <1568m, cholesterol <204m calories 30% from fat and 7% or less from saturated fats, daily to have 5 or more servings of fruits and vegetables.  Biometrics:     Pre Biometrics - 06/25/16 1428      Pre Biometrics   Height _0  (1.88 m)   Weight 271 lb 3.2 oz (123 kg)   Waist Circumference 52 inches   Hip Circumference 44 inches   Waist to Hip Ratio 1.18 %   BMI (Calculated) 34.9       Nutrition Therapy Plan and Nutrition Goals:     Nutrition Therapy & Goals - 08/05/16 1527      Nutrition Therapy   RD appointment defered Yes     Personal Nutrition Goals   Comments patient states his Wife is a nutritionist     Intervention Plan   Intervention Nutrition handout(s) given to patient.   Expected Outcomes Long Term Goal: Adherence to prescribed nutrition plan.;Short Term Goal: Understand basic principles of dietary content, such as calories, fat, sodium, cholesterol and nutrients.      Nutrition Discharge: Rate Your Plate Scores:   Nutrition Goals Re-Evaluation:     Nutrition Goals Re-Evaluation    Row Name 08/29/16 1250             Goals   Current Weight 267 lb (121.1 kg)       Comment When asked to meet with the Dietician he states his  wife was a Engineer, maintenance (IT) and she helps him.        Expected Outcome Short: lose weight by dieting better at home with the help of his wife. Long: Maintain a healthy diet for weight loss          Nutrition Goals Discharge (Final Nutrition Goals Re-Evaluation):     Nutrition Goals Re-Evaluation - 08/29/16 1250      Goals   Current Weight 267 lb (121.1 kg)   Comment When asked to meet with the Dietician he states his wife was a nutritionist and she helps him.    Expected Outcome Short: lose weight by dieting better at home with the help of his wife. Long: Maintain a healthy diet for weight loss      Psychosocial: Target Goals: Acknowledge presence or absence of  significant depression and/or stress, maximize coping skills, provide positive support system. Participant is able to verbalize types and ability to use techniques and skills needed for reducing stress and depression.   Initial Review & Psychosocial Screening:     Initial Psych Review & Screening - 06/25/16 [place]? Yes   Comments Mr Clason has good support from his wife and children. 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 and ICU hospitalization in June. Mr Byrom is looking forward to supervised exercise and learning more about his COPD.     Barriers   Psychosocial barriers to participate in program There are no identifiable barriers or psychosocial needs.;The patient should benefit from training in stress management and relaxation.     Screening Interventions   Interventions Encouraged to exercise      Quality of Life Scores:     Quality of Life - 06/25/16 1540      Quality of Life Scores   Health/Function Pre 20.06 %   Socioeconomic Pre 19.83 %   Psych/Spiritual Pre 20.33 %   Family Pre 21 %   GLOBAL Pre 20.19 %      PHQ-9: Recent Review Flowsheet Data    Depression screen [facility] 2/9 06/25/2016 09/26/2015 08/07/2015   Decreased Interest _0 Down, Depressed, Hopeless 0 0 0   PHQ - 2 Score _1 Altered sleeping _2 Tired, decreased energy _3 Change in appetite 1 2 0   Feeling bad or failure about yourself  0 0 0   Trouble concentrating _4 Moving slowly or fidgety/restless 0 1 0   Suicidal thoughts 0 0 0   PHQ-9 Score _5 Difficult doing work/chores Very difficult Somewhat difficult Somewhat difficult     Interpretation of Total Score  Total Score Depression Severity:  1-4 = Minimal depression, 5-9 = Mild depression, 10-14 = Moderate depression, 15-19 = Moderately severe depression, 20-27 = Severe depression   Psychosocial Evaluation and Intervention:     Psychosocial Evaluation -  07/22/16 1113      Psychosocial Evaluation & Interventions   Comments Mr. Lammert has returned this year and now qualifies for the Pulmonary Rehab program.  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  He "coded" twice in Feb 25, 2022 according to his report and his Dr has encouraged him to come for rehabilitation.  Mr. Bick[facility]) has a strong support system with a spouse of 24 years, (that he calls his "gift") and a daughter who lives  locally.  He is also actively involved in his local church and a men's prayer group.  Rush Landmark has multiple health issues in addition to cardiac and pulmonary with sleep apnea; a bad back; a knee replaced and has had surgery on both feet.  He uses a CPAP to sleep, which is helpful and he, and states his appetite is "too good!"  Rush Landmark denies a history of depression or anxiety or current symptoms.  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  But he saw a psychiatrist and a pastor to help him through this.  Bill's mood is generally positive and he reports other than his health, he has minimal stress in his life.  He has goals to lose wieght; increase his stamina and strength, and improve his shortness of breath.  Staff will be following Bill throughout the course of this program.     Expected Outcomes Rush Landmark will benefit from consistent exercise to achieve his stated goals.  He also should meet with the dietician to address his weight loss goal.  With the stress in Bill's life due to multiple health issues, attending the psychoeducational components of this program will be helpful as well to refresh his coping strategies.     Continue Psychosocial Services  Follow up required by staff      Psychosocial Re-Evaluation:     Psychosocial Re-Evaluation    Jarales Name 07/15/16 1428 08/05/16 1530 08/19/16 1117 08/28/16 1128 08/29/16 1253     Psychosocial Re-Evaluation   Current issues with  - None Identified   -  - None Identified   Comments Mr Riva has attended [facility] 3 sessions. He is still nervous about not wearing a heart monitor due to his past heart codes. He does have a strong [place] faith and has had several people praying for him which has helped the most with his anxiety of another cardiac episode.  He also worries that he is a burden to his wife, since Mr Mcclintock can not drive and she has her own health conditions.  Mr. Siler seems reserved about his feelings but has been talking about his activities of his daily life. Its healthy for Mr. Sebek to talk about his daily life to develope better communication between staff and himself. Counselor follow with Rush Landmark today reporting he is noticing some improvement with his stamina as he walks on his "off days" here and states he can go further and longer than before coming into this program.  He continues to sleep okay and his mood is stable - although he went to a funeral of a 38 year old friend this past weekend and was sad about this today.  Rush Landmark states his stress currently is his spouse's new dog who is so big and is "dragging her" when she walks him - which concerns him for his spouse's safety.  Discussed ways to cope with this and his recent loss.  Counselor commended Engineer, technical sales on his progress made and commitment to consistent exercise.  Counselor follow up with Rush Landmark today reporting the education portion of this program has been tremendously helpful in learning how to breathe better; what is COPD; and how to manage his disease in a more positive way.  Counselor commended Engineer, technical sales on his participation in every aspect of this program.    -   Expected Outcomes Short: continue exercising and learning from the health lectures. Long term: in 3 months,  Mr Blancett will be driving and will share responsibilities with home activities. Short Term: Continue to exercise and have a positive outlook. Long Term: Mr. Leverette would like to do more things at home  such as read, play solitare, and chores. Rush Landmark will benefit from consistent exercise and reminders of positive ways to cope with stress in his life currently.  - Continue to exercise and stay positive with any stress he may have   Interventions Encouraged to attend Pulmonary Rehabilitation for the exercise;Relaxation education;Stress management education Encouraged to attend Pulmonary Rehabilitation for the exercise Relaxation education;Stress management education  - Encouraged to attend Pulmonary Rehabilitation for the exercise   Continue Psychosocial Services  Follow up required by counselor Follow up required by staff Follow up required by staff  - Follow up required by staff      Psychosocial Discharge (Final Psychosocial Re-Evaluation):     Psychosocial Re-Evaluation - 08/29/16 1253      Psychosocial Re-Evaluation   Current issues with None Identified   Expected Outcomes Continue to exercise and stay positive with any stress he may have   Interventions Encouraged to attend Pulmonary Rehabilitation for the exercise   Continue Psychosocial Services  Follow up required by staff      Education: Education Goals: Education classes will be provided on a weekly basis, covering required topics. Participant will state understanding/return demonstration of topics presented.  Learning Barriers/Preferences:     Learning Barriers/Preferences - 06/25/16 1517      Learning Barriers/Preferences   Learning Barriers Exercise Concerns   Learning Preferences None      Education Topics: Initial Evaluation Education: - Verbal, written and demonstration of respiratory meds, RPE/PD scales, oximetry and breathing techniques. Instruction on use of nebulizers and MDIs: cleaning and proper use, rinsing mouth with steroid doses and importance of monitoring MDI activations.   Pulmonary Rehab from 09/11/2016 in [facility] Cardiac and Pulmonary Rehab  Date  06/25/16  Educator  LB  Instruction Review Code  (retired)  2- meets goals/outcomes      General Nutrition Guidelines/Fats and Fiber: -Group instruction provided by verbal, written material, models and posters to present the general guidelines for heart healthy nutrition. Gives an explanation and review of dietary fats and fiber.   Cardiac Rehab from 10/31/2015 in [facility] Cardiac and Pulmonary Rehab  Date  10/10/15  Educator  CR  Instruction Review Code (retired)  R- Review/reinforce [Second Class]      Controlling Sodium/Reading Food Labels: -Group verbal and written material supporting the discussion of sodium use in heart healthy nutrition. Review and explanation with models, verbal and written materials for utilization of the food label.   Cardiac Rehab from 10/31/2015 in [facility] Cardiac and Pulmonary Rehab  Date  08/22/15  Educator  Erlene Quan  Instruction Review Code (retired)  2- meets goals/outcomes      Exercise Physiology & Risk Factors: - Group verbal and written instruction with models to review the exercise physiology of the cardiovascular system and associated critical values. Details cardiovascular disease risk factors and the goals associated with each risk factor.   Cardiac Rehab from 10/31/2015 in [facility] Cardiac and Pulmonary Rehab  Date  10/24/15  Educator  [facility]  Instruction Review Code (retired)  2- meets goals/outcomes      Aerobic Exercise & Resistance Training: - Gives group verbal and written discussion on the health impact of inactivity. On the components of aerobic and resistive training programs and the benefits of this training and how to safely progress through these  programs.   Cardiac Rehab from 10/31/2015 in [facility] Cardiac and Pulmonary Rehab  Date  10/26/15  Educator  [facility]  Instruction Review Code (retired)  2- Statistician, Balance, General Exercise Guidelines: - Provides group verbal and written instruction on the benefits of flexibility and balance training programs. Provides  general exercise guidelines with specific guidelines to those with heart or lung disease. Demonstration and skill practice provided.   Pulmonary Rehab from 09/11/2016 in [facility] Cardiac and Pulmonary Rehab  Date  09/04/16  Educator  AS  Instruction Review Code (retired)  2- meets goals/outcomes      Stress Management: - Provides group verbal and written instruction about the health risks of elevated stress, cause of high stress, and healthy ways to reduce stress.   Cardiac Rehab from 10/31/2015 in [facility] Cardiac and Pulmonary Rehab  Date  09/07/15  Educator  CE  Instruction Review Code (retired)  2- meets goals/outcomes      Depression: - Provides group verbal and written instruction on the correlation between heart/lung disease and depressed mood, treatment options, and the stigmas associated with seeking treatment.   Pulmonary Rehab from 09/11/2016 in [facility] Cardiac and Pulmonary Rehab  Instruction Review Code (retired)  Not applicable [Patient states he already had]      Exercise & Equipment Safety: - Individual verbal instruction and demonstration of equipment use and safety with use of the equipment.   Infection Prevention: - Provides verbal and written material to individual with discussion of infection control including proper hand washing and proper equipment cleaning during exercise session.   Falls Prevention: - Provides verbal and written material to individual with discussion of falls prevention and safety.   Diabetes: - Individual verbal and written instruction to review signs/symptoms of diabetes, desired ranges of glucose level fasting, after meals and with exercise. Advice that pre and post exercise glucose checks will be done for 3 sessions at entry of program.   Chronic Lung Diseases: - Group verbal and written instruction to review new updates, new respiratory medications, new advancements in procedures and treatments. Provide informative websites and "800" numbers  of self-education.   Pulmonary Rehab from 09/11/2016 in [facility] Cardiac and Pulmonary Rehab  Date  08/07/16  Educator  [facility]  Instruction Review Code (retired)  2- meets goals/outcomes      Lung Procedures: - Group verbal and written instruction to describe testing methods done to diagnose lung disease. Review the outcome of test results. Describe the treatment choices: Pulmonary Function Tests, ABGs and oximetry.   Energy Conservation: - Provide group verbal and written instruction for methods to conserve energy, plan and organize activities. Instruct on pacing techniques, use of adaptive equipment and posture/positioning to relieve shortness of breath.   Pulmonary Rehab from 09/11/2016 in [facility] Cardiac and Pulmonary Rehab  Date  07/03/16  Educator  [facility]  Instruction Review Code (retired)  2- meets goals/outcomes      Triggers: - Group verbal and written instruction to review types of environmental controls: home humidity, furnaces, filters, dust mite/pet prevention, HEPA vacuums. To discuss weather changes, air quality and the benefits of nasal washing.   Exacerbations: - Group verbal and written instruction to provide: warning signs, infection symptoms, calling MD promptly, preventive modes, and value of vaccinations. Review: effective airway clearance, coughing and/or vibration techniques. Create an Sports administrator.   Oxygen: - Individual and group verbal and written instruction on oxygen therapy. Includes supplement oxygen, available portable oxygen systems, continuous and intermittent flow  rates, oxygen safety, concentrators, and Medicare reimbursement for oxygen.   Respiratory Medications: - Group verbal and written instruction to review medications for lung disease. Drug class, frequency, complications, importance of spacers, rinsing mouth after steroid MDI's, and proper cleaning methods for nebulizers.   Pulmonary Rehab from 09/11/2016 in [facility] Cardiac and Pulmonary Rehab  Date  06/25/16   Educator  LB  Instruction Review Code (retired)  2- meets goals/outcomes      AED/CPR: - Group verbal and written instruction with the use of models to demonstrate the basic use of the AED with the basic ABC's of resuscitation.   Breathing Retraining: - Provides individuals verbal and written instruction on purpose, frequency, and proper technique of diaphragmatic breathing and pursed-lipped breathing. Applies individual practice skills.   Pulmonary Rehab from 09/11/2016 in [facility] Cardiac and Pulmonary Rehab  Date  06/25/16  Educator  LB  Instruction Review Code (retired)  2- Lawyer and Physiology of the Lungs: - Group verbal and written instruction with the use of models to provide basic lung anatomy and physiology related to function, structure and complications of lung disease.   Anatomy & Physiology of the Heart: - Group verbal and written instruction and models provide basic cardiac anatomy and physiology, with the coronary electrical and arterial systems. Review of: AMI, Angina, Valve disease, Heart Failure, Cardiac Arrhythmia, Pacemakers, and the ICD.   Cardiac Rehab from 10/31/2015 in [facility] Cardiac and Pulmonary Rehab  Date  09/12/15  Educator  SB  Instruction Review Code (retired)  2- meets goals/outcomes      Heart Failure: - Group verbal and written instruction on the basics of heart failure: signs/symptoms, treatments, explanation of ejection fraction, enlarged heart and cardiomyopathy.   Sleep Apnea: - Individual verbal and written instruction to review Obstructive Sleep Apnea. Review of risk factors, methods for diagnosing and types of masks and machines for OSA.   Pulmonary Rehab from 09/11/2016 in [facility] Cardiac and Pulmonary Rehab  Date  06/25/16  Educator  LB  Instruction Review Code (retired)  2- meets goals/outcomes      Anxiety: - Provides group, verbal and written instruction on the correlation between heart/lung disease and  anxiety, treatment options, and management of anxiety.   Relaxation: - Provides group, verbal and written instruction about the benefits of relaxation for patients with heart/lung disease. Also provides patients with examples of relaxation techniques.   Pulmonary Rehab from 09/11/2016 in [facility] Cardiac and Pulmonary Rehab  Date  08/14/16  Educator  [facility]  Instruction Review Code (retired)  2- Meets goals/outcomes      Cardiac Medications: - Group verbal and written instruction to review commonly prescribed medications for heart disease. Reviews the medication, class of the drug, and side effects.   Cardiac Rehab from 10/31/2015 in [facility] Cardiac and Pulmonary Rehab  Date  09/21/15  Educator  CE  Instruction Review Code (retired)  2- meets goals/outcomes      Know Your Numbers: -Group verbal and written instruction about important numbers in your health.  Review of Cholesterol, Blood Pressure, Diabetes, and BMI and the role they play in your overall health.   Other: -Provides group and verbal instruction on various topics (see comments)    Knowledge Questionnaire Score:     Knowledge Questionnaire Score - 06/25/16 1517      Knowledge Questionnaire Score   Pre Score 10/10       Core Components/Risk Factors/Patient Goals at Admission:     Personal Goals  and Risk Factors at Admission - 06/25/16 1522      Core Components/Risk Factors/Patient Goals on Admission    Weight Management Yes;Weight Loss   Intervention Weight Management: Develop a combined nutrition and exercise program designed to reach desired caloric intake, while maintaining appropriate intake of nutrient and fiber, sodium and fats, and appropriate energy expenditure required for the weight goal.;Weight Management: Provide education and appropriate resources to help participant work on and attain dietary goals.;Weight Management/Obesity: Establish reasonable short term and long term weight goals.;Obesity: Provide  education and appropriate resources to help participant work on and attain dietary goals.   Admit Weight 271 lb 3.2 oz (123 kg)   Goal Weight: Short Term 266 lb (120.7 kg)   Goal Weight: Long Term 220 lb (99.8 kg)   Expected Outcomes Long Term: Adherence to nutrition and physical activity/exercise program aimed toward attainment of established weight goal;Short Term: Continue to assess and modify interventions until short term weight is achieved;Weight Maintenance: Understanding of the daily nutrition guidelines, which includes 25-35% calories from fat, 7% or less cal from saturated fats, less than 220m cholesterol, less than 1.5gm of sodium, & 5 or more servings of fruits and vegetables daily;Weight Loss: Understanding of general recommendations for a balanced deficit meal plan, which promotes 1-2 lb weight loss per week and includes a negative energy balance of [phone] kcal/d;Understanding recommendations for meals to include 15-35% energy as protein, 25-35% energy from fat, 35-60% energy from carbohydrates, less than 2041mof dietary cholesterol, 20-35 gm of total fiber daily;Understanding of distribution of calorie intake throughout the day with the consumption of 4-5 meals/snacks   Improve shortness of breath with ADL's Yes   Intervention Provide education, individualized exercise plan and daily activity instruction to help decrease symptoms of SOB with activities of daily living.   Expected Outcomes Short Term: Achieves a reduction of symptoms when performing activities of daily living.   Develop more efficient breathing techniques such as purse lipped breathing and diaphragmatic breathing; and practicing self-pacing with activity Yes   Intervention Provide education, demonstration and support about specific breathing techniuqes utilized for more efficient breathing. Include techniques such as pursed lipped breathing, diaphragmatic breathing and self-pacing activity.   Expected Outcomes Short Term:  Participant will be able to demonstrate and use breathing techniques as needed throughout daily activities.   Increase knowledge of respiratory medications and ability to use respiratory devices properly  Yes  Anoro E and Albuterol MDI; spacer given   Intervention Provide education and demonstration as needed of appropriate use of medications, inhalers, and oxygen therapy.   Expected Outcomes Short Term: Achieves understanding of medications use. Understands that oxygen is a medication prescribed by physician. Demonstrates appropriate use of inhaler and oxygen therapy.   Heart Failure Yes   Intervention Provide a combined exercise and nutrition program that is supplemented with education, support and counseling about heart failure. Directed toward relieving symptoms such as shortness of breath, decreased exercise tolerance, and extremity edema.   Expected Outcomes Improve functional capacity of life;Short term: Attendance in program 2-3 days a week with increased exercise capacity. Reported lower sodium intake. Reported increased fruit and vegetable intake. Reports medication compliance.;Short term: Daily weights obtained and reported for increase. Utilizing diuretic protocols set by physician.;Long term: Adoption of self-care skills and reduction of barriers for early signs and symptoms recognition and intervention leading to self-care maintenance.   Hypertension Yes   Intervention Provide education on lifestyle modifcations including regular physical activity/exercise, weight management, moderate sodium restriction and  increased consumption of fresh fruit, vegetables, and low fat dairy, alcohol moderation, and smoking cessation.;Monitor prescription use compliance.   Expected Outcomes Short Term: Continued assessment and intervention until BP is < 140/69m HG in hypertensive participants. < 130/833mHG in hypertensive participants with diabetes, heart failure or chronic kidney disease.;Long Term:  Maintenance of blood pressure at goal levels.   Lipids Yes   Intervention Provide education and support for participant on nutrition & aerobic/resistive exercise along with prescribed medications to achieve LDL <7057mHDL >7m63m Expected Outcomes Short Term: Participant states understanding of desired cholesterol values and is compliant with medications prescribed. Participant is following exercise prescription and nutrition guidelines.;Long Term: Cholesterol controlled with medications as prescribed, with individualized exercise RX and with personalized nutrition plan. Value goals: LDL < 70mg30mL > 40 mg.      Core Components/Risk Factors/Patient Goals Review:      Goals and Risk Factor Review    Row Name 07/15/16 1419 08/05/16 1517 08/29/16 1219         Core Components/Risk Factors/Patient Goals Review   Personal Goals Review Weight Management/Obesity;Develop more efficient breathing techniques such as purse lipped breathing and diaphragmatic breathing and practicing self-pacing with activity.;Improve shortness of breath with ADL's Weight Management/Obesity;Improve shortness of breath with ADL's;Develop more efficient breathing techniques such as purse lipped breathing and diaphragmatic breathing and practicing self-pacing with activity. Weight Management/Obesity;Improve shortness of breath with ADL's;Increase knowledge of respiratory medications and ability to use respiratory devices properly.;Lipids;Hypertension;Develop more efficient breathing techniques such as purse lipped breathing and diaphragmatic breathing and practicing self-pacing with activity.     Review Mr HendeOrecompleted 3 sessions of [facility]. He is cued to do PLB and does have shortness of breath with most activity. He would like to mow at home, but with the heat and shortness of breath, he has not been able to mow, but he hopes to in the future while improving in Lung[place] maintaining his weight at 273lbs, does  not want to meet with the dietitian at this time, and states his wife cooks healthy.[facility] even [facility] days. Mr. HendeLeclairebeen working hard the last few sessions pushing himself. He states some days are better than others with his shortness of breath but it has not been too bad. Bill needs to work on his PLB techniques when he gets short of breath. His exercise has been going well but feels like he needs to improve more. His blood pressure has been stable. He is using his respiratory medications as prescribed with a spacer. His weight has not changed much since the start of the program and sometimes states he eats too much.     Expected Outcomes Long term: Continue to attend [facility] to increase his stamina and conditioning to improve his home exercise; Short: Learn to use PLB without cueing. Long term: Continue to attend [facility] to increase his strength to [facility] Learn to use PLB without cueing. Lose weight for easier breathing. Short: Work on PLB techniques. Lose more weight. Increase exercise routine. Long: use PLB without cueing. Increase his SOB and his ADL        Core Components/Risk Factors/Patient Goals at Discharge (Final Review):      Goals and Risk Factor Review - 08/29/16 1219      Core Components/Risk Factors/Patient Goals Review   Personal Goals Review Weight Management/Obesity;Improve shortness of  breath with ADL's;Increase knowledge of respiratory medications and ability to use respiratory devices properly.;Lipids;Hypertension;Develop more efficient breathing techniques such as purse lipped breathing and diaphragmatic breathing and practicing self-pacing with activity.   Review Bill needs to work on his PLB techniques when he gets short of breath. His exercise has been going well but feels like he needs to improve more. His blood pressure has been stable. He  is using his respiratory medications as prescribed with a spacer. His weight has not changed much since the start of the program and sometimes states he eats too much.   Expected Outcomes Short: Work on PLB techniques. Lose more weight. Increase exercise routine. Long: use PLB without cueing. Increase his SOB and his ADL      ITP Comments:     ITP Comments    Row Name 07/10/16 1306 07/22/16 0741 08/02/16 1345 08/19/16 0851 09/16/16 0816   ITP Comments Bill has been absent due to other Dr appointments. 30 day note review with Medical Director of [place] Dr. Emily Filbert  Reviewed home exercise with pt today.  Pt plans to walk  for exercise.  Reviewed THR, pulse, RPE, sign and symptoms, NTG use, and when to call 911 or MD.  Also discussed weather considerations and indoor options.  Pt voiced understanding. 30 day review completed ITP sent to Dr. Ramonita Lab for Dr. Emily Filbert Director of [place]. Continue with ITP unless changes are made by physician.  30 day review completed. ITP sent to Dr. Emily Filbert Director of [place]. Continue with ITP unless changes are made by physician.        Comments: 30 day review

## 2016-09-18 DIAGNOSIS — J449 Chronic obstructive pulmonary disease, unspecified: Secondary | ICD-10-CM | POA: Diagnosis not present

## 2016-09-18 DIAGNOSIS — I208 Other forms of angina pectoris: Secondary | ICD-10-CM

## 2016-09-18 NOTE — Progress Notes (Signed)
Daily Session Note  Patient Details  Name: Bradley Barnes. MRN: [id number] Date of Birth: Nov 16, 1940 Referring Provider:     Pulmonary Rehab from 06/25/2016 in [facility] Cardiac and Pulmonary Rehab  Referring Provider  Ramonita Lab MD      Encounter Date: 09/18/2016  Check In:     Session Check In - 09/18/16 1016      Check-In   Location [facility]-Cardiac & Pulmonary Rehab   Staff Present Alberteen Sam, MA, ACSM RCEP, Exercise Physiologist;Destan Franchini Oletta Darter, BA, ACSM CEP, Exercise Physiologist;Joseph Flavia Shipper   Supervising physician immediately available to respond to emergencies [facility] immediately available ER MD   Physician(s) Jimmye Norman and Mariea Clonts   Medication changes reported     No   Fall or balance concerns reported    No   Warm-up and Cool-down Performed as group-led Location manager Performed Yes   VAD Patient? No     Pain Assessment   Currently in Pain? No/denies         History  Smoking Status  . Former Smoker  . Packs/day: 1.00  . Years: 33.00  . Types: Cigarettes  . Quit date: 10/03/1991  Smokeless Tobacco  . Never Used    Goals Met:  Independence with exercise equipment Exercise tolerated well No report of cardiac concerns or symptoms Strength training completed today  Goals Unmet:  Not Applicable  Comments: Reviewed RPE scale, THR and program prescription with pt today.  Pt voiced understanding and was given a copy of goals to take home.   Short: Use RPE daily to regulate intensity.  Long: Follow program prescription in THR.    Dr. Emily Filbert is Medical Director for [place] and [facility].

## 2016-09-25 ENCOUNTER — Encounter: Payer: Medicare HMO | Attending: Internal Medicine

## 2016-09-25 DIAGNOSIS — J449 Chronic obstructive pulmonary disease, unspecified: Secondary | ICD-10-CM

## 2016-09-25 DIAGNOSIS — I208 Other forms of angina pectoris: Secondary | ICD-10-CM

## 2016-09-25 NOTE — Progress Notes (Signed)
Daily Session Note  Patient Details  Name: Bradley Barnes. MRN: [id number] Date of Birth: 03-Jul-1940 Referring Provider:     Pulmonary Rehab from 06/25/2016 in [facility] Cardiac and Pul[facility] Rehab  Referring Provider  Ramonita Lab MD      Encounter Date: 09/25/2016  Check In:     Session Check In - 09/25/16 1047      Check-In   Location [facility]-Cardiac & Pulmonary Re[facility]   Staff Present Alberteen Sam, MA, ACSM RCEP, Exercise Physiologist;Deyani Hegarty Oletta Darter, BA, ACSM CEP, Exercise Physiologist;Joseph Flavia Shipper   Supervising physician immediately available to respond to emergencies [facility] immediately available ER MD   Physician(s) Archie Balboa  and Oswaldo Done   Medication changes reported     No   Fall or balance concerns reported    No   Warm-up and Cool-down Performed as group-led instruction   Resistance Training Performed Yes   VAD Patient? No     Pain Assessment   Currently in Pain? No/denies         History  Smoking Status  . Former Smoker  . Packs/day: 1.00  . Years: 33.00  . Types: Cigarettes  . Quit date: 10/03/1991  Smokeless Tobacco  . Never Used    Goals Met:  Proper associated with RPD/PD & O2 Sat Independence with exercise equipment Exercise tolerated well Strength training completed today  Goals Unmet:  Not Applicable  Comments: Pt able to follow exercise prescription today without complaint.  Will continue to monitor for progression.    Dr. Emily Filbert is Medical Director for [place] and [facility].

## 2016-09-30 ENCOUNTER — Encounter: Payer: Medicare HMO | Admitting: *Deleted

## 2016-09-30 DIAGNOSIS — J449 Chronic obstructive pulmonary disease, unspecified: Secondary | ICD-10-CM

## 2016-09-30 NOTE — Progress Notes (Signed)
Daily Session Note  Patient Details  Name: Bradley Barnes. MRN: [id number] Date of Birth: 02/27/1940 Referring Provider:     Pulmonary Rehab from 06/25/2016 in [facility] Cardiac and Pulmonary Rehab  Referring Provider  Ramonita Lab MD      Encounter Date: 09/30/2016  Check In:     Session Check In - 09/30/16 1038      Check-In   Location [facility]Cardiac & Pulmonary Re[facility]   Staff Present Earlean Shawl, BS, ACSM CEP, Exercise Physiologist;Joseph Foy [place], BA, ACSM CEP, Exercise Physiologist   Supervising physician immediately available to respond to emergencies [facility] immediately available ER MD   Physician(s) Dr. Jimmye Norman and Dr. Corky Downs   Medication changes reported     No   Fall or balance concerns reported    No   Warm-up and Cool-down Performed as group-led instruction   Resistance Training Performed Yes   VAD Patient? No     Pain Assessment   Currently in Pain? No/denies   Multiple Pain Sites No         History  Smoking Status  . Former Smoker  . Packs/day: 1.00  . Years: 33.00  . Types: Cigarettes  . Quit date: 10/03/1991  Smokeless Tobacco  . Never Used    Goals Met:  Proper associated with RPD/PD & O2 Sat Independence with exercise equipment Exercise tolerated well Strength training completed today  Goals Unmet:  Not Applicable  Comments: Pt able to follow exercise prescription today without complaint.  Will continue to monitor for progression.    Dr. Emily Filbert is Medical Director for [name] and [facility].

## 2016-10-02 DIAGNOSIS — J449 Chronic obstructive pulmonary disease, unspecified: Secondary | ICD-10-CM | POA: Diagnosis not present

## 2016-10-02 NOTE — Progress Notes (Signed)
Daily Session Note  Patient Details  Name: Bradley C Fedor Jr. MRN: [id number] Date of Birth: 03/12/1940 Referring Provider:     Pulmonary Rehab from 06/25/2016 in [facility] Cardiac and Pulmonary Rehab  Referring Provider  Klein, Bert MD      Encounter Date: 10/02/2016  Check In:     Session Check In - 10/02/16 1011      Check-In   Location [facility]-Cardiac & Pulmonary Rehab   Staff Present Jessica Hawkins, MA, ACSM RCEP, Exercise Physiologist;Amanda Sommer, BA, ACSM CEP, Exercise Physiologist;Joseph Hood RCP,RRT,BSRT   Supervising physician immediately available to respond to emergencies [facility] immediately available ER MD   Physician(s) Dr. Veronese and Williams   Medication changes reported     No   Fall or balance concerns reported    No   Warm-up and Cool-down Performed as group-led instruction   Resistance Training Performed Yes   VAD Patient? No     Pain Assessment   Currently in Pain? No/denies   Multiple Pain Sites No         History  Smoking Status  . Former Smoker  . Packs/day: 1.00  . Years: 33.00  . Types: Cigarettes  . Quit date: 10/03/1991  Smokeless Tobacco  . Never Used    Goals Met:  Independence with exercise equipment Exercise tolerated well Personal goals reviewed No report of cardiac concerns or symptoms Strength training completed today  Goals Unmet:  Not Applicable  Comments: Pt able to follow exercise prescription today without complaint.  Will continue to monitor for progression.   Dr. Mark Miller is Medical Director for [facility] and [facility]. 

## 2016-10-07 DIAGNOSIS — J449 Chronic obstructive pulmonary disease, unspecified: Secondary | ICD-10-CM | POA: Diagnosis not present

## 2016-10-07 NOTE — Progress Notes (Signed)
Daily Session Note  Patient Details  Name: Bradley Barnes. MRN: [id number] Date of Birth: Jan 19, 1941 Referring Provider:     Pulmonary Rehab from 06/25/2016 in [facility] Card[facility] and Pulmonary Rehab  Referring Provider  Ramonita Lab MD      Encounter Date: 10/07/2016  Check In:     Session Check In - 10/07/16 1005      Check-In   Location [facility]-Cardiac & Pulmonary Rehab   Staff Present Alberteen Sam, MA, ACSM RCEP, Exercise Physiologist;Amanda Oletta Darter, BA, ACSM CEP, Exercise Physiologist;Kelly Amedeo Plenty, BS, ACSM CEP, Exercise Physiologist;Ezekeil [place]hel Flavia Shipper   Supervising physician immediately available to respond to emergencies [facility] immediately available ER MD   Physician(s) Dr. Corky Downs and Jimmye Norman   Medication changes reported     No   Fall or balance concerns reported    No   Warm-up and Cool-down Performed as group-led instruction   Resistance Training Performed Yes   VAD Patient? No     Pain Assessment   Currently in Pain? No/denies   Multiple Pain Sites No         History  Smoking Status  . Former Smoker  . Packs/day: 1.00  . Years: 33.00  . Types: Cigarettes  . Quit date: 10/03/1991  Smokeless Tobacco  . Never Used    Goals Met:  Independence with exercise equipment Exercise tolerated well No report of cardiac concerns or symptoms Strength training completed today  Goals Unmet:  Not Applicable  Comments: Pt able to follow exercise prescription today without complaint.  Will continue to monitor for progression.   Dr. Emily Filbert is Medical Director for [place] and [facility].

## 2016-10-11 DIAGNOSIS — J449 Chronic obstructive pulmonary disease, unspecified: Secondary | ICD-10-CM | POA: Diagnosis not present

## 2016-10-11 NOTE — Progress Notes (Signed)
Daily Session Note  Patient Details  Name: Bradley Barnes. MRN: [id number] Date of Birth: Feb 09, 1940 Referring Provider:     Pulmonary Rehab from 06/25/2016 in [facility] Cardiac and Pulmonary Rehab  Referring Provider  Ramonita Lab MD      Encounter Date: 10/11/2016  Check In:     Session Check In - 10/11/16 0954      Check-In   Staff Present Nada Maclachlan, BA, ACSM CEP, Exercise Physiologist;Carroll Enterkin, RN, BSN;Joseph Flavia Shipper   Supervising physician immediately available to respond to emergencies [facility] immediately available ER MD   Physician(s) Dr. Archie Balboa and Joni Fears   Medication changes reported     No   Fall or balance concerns reported    No   Warm-up and Cool-down Performed as group-led instruction   Resistance Training Performed Yes   VAD Patient? No     Pain Assessment   Currently in Pain? No/denies   Multiple Pain Sites No         History  Smoking Status  . Former Smoker  . Packs/day: 1.00  . Years: 33.00  . Types: Cigarettes  . Quit date: 10/03/1991  Smokeless Tobacco  . Never Used    Goals Met:  Independence with exercise equipment Exercise tolerated well No report of cardiac concerns or symptoms Strength training completed today  Goals Unmet:  Not Applicable  Comments: Pt able to follow exercise prescription today without complaint.  Will continue to monitor for progression.   Dr. Emily Filbert is Medical Director for [place] and [facility].

## 2016-10-14 DIAGNOSIS — J449 Chronic obstructive pulmonary disease, unspecified: Secondary | ICD-10-CM

## 2016-10-14 NOTE — Progress Notes (Signed)
Pulmonary Individual Treatment Plan  Patient Details  Name: Bradley Barnes. MRN: [id number] Date of Birth: May 10, 1940 Referring Provider:     Pulmonary Rehab from 06/25/2016 in [facility]  Referring Provider  Ramonita Lab MD      Initial Encounter Date:    Pulmonary Rehab from 06/25/2016 in [facility]  Date  06/25/16  Referring Provider  Ramonita Lab MD      Visit Diagnosis: Chronic obstructive pulmonary disease, unspecified COPD type ([place])  Patient's Home Medications on Admission:  Current Outpatient Prescriptions:  .  Acetaminophen 500 MG coapsule, Take by mouth., Disp: , Rfl:  .  albuterol (PROAIR HFA) 108 (90 Base) MCG/ACT inhaler, Inhale into the lungs., Disp: , Rfl:  .  Alum Hydroxide-Mag Carbonate (GAVISCON EXTRA RELIEF FORMULA PO), Take by mouth., Disp: , Rfl:  .  amLODipine (NORVASC) 10 MG tablet, Take by mouth., Disp: , Rfl:  .  aspirin EC 81 MG tablet, Take by mouth., Disp: , Rfl:  .  atorvastatin (LIPITOR) 40 MG tablet, Take by mouth., Disp: , Rfl:  .  furosemide (LASIX) 40 MG tablet, Take 40 mg by mouth., Disp: , Rfl:  .  Glycopyrrolate-Formoterol (BEVESPI AEROSPHERE) 9-4.8 MCG/ACT AERO, Inhale 2 puffs into the lungs 2 (two) times daily., Disp: 10.7 g, Rfl: 5 .  Investigational - Study Medication, Take by mouth. [facility] [facility] IDS, Disp: , Rfl:  .  isosorbide mononitrate (IMDUR) 60 MG 24 hr tablet, Take by mouth., Disp: , Rfl:  .  metoprolol (TOPROL-XL) 200 MG 24 hr tablet, Take by mouth., Disp: , Rfl:  .  mexiletine (MEXITIL) 200 MG capsule, Take 200 mg by mouth 3 (three) times daily., Disp: , Rfl:  .  Multiple Vitamin (MULTI-VITAMINS) TABS, Take by mouth., Disp: , Rfl:  .  nitroGLYCERIN (NITROSTAT) 0.4 MG SL tablet, Place under the tongue., Disp: , Rfl:  .  Omega-3 1000 MG CAPS, Take by mouth., Disp: , Rfl:  .  ramipril (ALTACE) 10 MG capsule, Take by mouth., Disp: , Rfl:  .  ranitidine (ZANTAC) 300 MG tablet, Take by  mouth., Disp: , Rfl:  .  sodium chloride (OCEAN) 0.65 % nasal spray, Place 1 spray into the nose as needed for congestion., Disp: , Rfl:  .  sodium chloride (V-R NASAL SPRAY SALINE) 0.65 % nasal spray, , Disp: , Rfl:  .  tamsulosin (FLOMAX) 0.4 MG CAPS capsule, Take 0.4 mg by mouth., Disp: , Rfl:  .  tiotropium (SPIRIVA) 18 MCG inhalation capsule, Place into inhaler and inhale., Disp: , Rfl:  .  Tiotropium Bromide Monohydrate (SPIRIVA RESPIMAT) 2.5 MCG/ACT AERS, Inhale 2 puffs into the lungs daily., Disp: 1 Inhaler, Rfl: 0 .  UNABLE TO FIND, Place 1 drop into both eyes daily. Med Name: Dextran 70, Disp: , Rfl:   Past Medical History: Past Medical History:  Diagnosis Date  . Chronic kidney disease   . COPD (chronic obstructive pulmonary disease) ([place])   . Hyperlipidemia   . Hypertension   . Myocardial infarction   . Sleep apnea     Tobacco Use: History  Smoking Status  . Former Smoker  . Packs/day: 1.00  . Years: 33.00  . Types: Cigarettes  . Quit date: 10/03/1991  Smokeless Tobacco  . Never Used    Labs: Recent Review Flowsheet Data    There is no flowsheet data to display.       Pulmonary Assessment Scores:     Pulmonary Assessment Scores  [place] Name 06/25/16 1519 08/12/16 1354       ADL UCSD   ADL Phase Entry Mid    SOB Score total 52 47    Rest 0 1    Walk 3 2    Stairs 4 5    Bath 2 1    Dress 2 0    Shop 4 4      mMRC Score   mMRC Score 2  -       Pulmonary Function Assessment:     Pulmonary Function Assessment - 06/25/16 1517      Pulmonary Function Tests   RV% 84 %   DLCO% 50 %     Initial Spirometry Results   FVC% 62 %   FEV1% 55 %   FEV1/FVC Ratio 64   Comments Test date 09/20/15     Post Bronchodilator Spirometry Results   FVC% 60 %   FEV1% 57 %   FEV1/FVC Ratio 68     Breath   Bilateral Breath Sounds Clear   Shortness of Breath Yes;Fear of Shortness of Breath;Limiting activity      Exercise Target Goals:    Exercise  Program Goal: Individual exercise prescription set with THRR, safety & activity barriers. Participant demonstrates ability to understand and report RPE using BORG scale, to self-measure pulse accurately, and to acknowledge the importance of the exercise prescription.  Exercise Prescription Goal: Starting with aerobic activity 30 plus minutes a day, 3 days per week for initial exercise prescription. Provide home exercise prescription and guidelines that participant acknowledges understanding prior to discharge.  Activity Barriers & Risk Stratification:     Activity Barriers & Cardiac Risk Stratification - 06/25/16 1426      Activity Barriers & Cardiac Risk Stratification   Activity Barriers Arthritis;Chest Pain/Angina;Joint Problems;Deconditioning;Muscular Weakness;Right Hip Replacement;Left Knee Replacement;Back Problems;Assistive Device;Shortness of Breath;Decreased Ventricular Function;Balance Concerns  uses rolling walker for support and balance   Cardiac Risk Stratification High      6 Minute Walk:     6 Minute Walk    Row Name 06/25/16 1421         6 Minute Walk   Phase Initial     Distance 730 feet     Walk Time 6 minutes     # of Rest Breaks 0     MPH 1.38     METS 0     RPE 16     Perceived Dyspnea  3     VO2 Peak 3.34     Symptoms Yes (comment)     Comments short of breath, using rolling walker     Resting HR 57 bpm     Resting BP 134/64     Max Ex. HR 72 bpm     Max Ex. BP 144/74     2 Minute Post BP 126/70       Interval HR   Baseline HR (retired) 57     4 Minute HR 72     5 Minute HR 72     6 Minute HR 72     2 Minute Post HR 56     Interval Heart Rate? Yes       Interval Oxygen   Interval Oxygen? Yes     Baseline Oxygen Saturation % 98 %     Resting Liters of Oxygen 0 L  Room Air     1 Minute Liters of Oxygen 0 L     2 Minute Liters of Oxygen 0 L  3 Minute Oxygen Saturation % 90 %     3 Minute Liters of Oxygen 0 L     4 Minute Oxygen  Saturation % 95 %     4 Minute Liters of Oxygen 0 L     5 Minute Oxygen Saturation % 99 %     5 Minute Liters of Oxygen 0 L     6 Minute Oxygen Saturation % 99 %     6 Minute Liters of Oxygen 0 L     2 Minute Post Oxygen Saturation % 100 %     2 Minute Post Liters of Oxygen 0 L       Oxygen Initial Assessment:     Oxygen Initial Assessment - 06/25/16 1522      Home Oxygen   Home Oxygen Device None      Oxygen Re-Evaluation:     Oxygen Re-Evaluation    Row Name 07/15/16 1419 08/05/16 1522 08/29/16 1243         Program Oxygen Prescription   Program Oxygen Prescription None None None       Home Oxygen   Home Oxygen Device None None None     Sleep Oxygen Prescription  - None None     Home Exercise Oxygen Prescription  - None None     Home at Rest Exercise Oxygen Prescription  - None None       Goals/Expected Outcomes   Short Term Goals  - To learn and understand importance of monitoring SPO2 with pulse oximeter and demonstrate accurate use of the pulse oximeter.;To Learn and understand importance of maintaining oxygen saturations>88%;To learn and demonstrate proper purse lipped breathing techniques or other breathing techniques.;To learn and demonstrate proper use of respiratory medications To learn and understand importance of monitoring SPO2 with pulse oximeter and demonstrate accurate use of the pulse oximeter.;To Learn and understand importance of maintaining oxygen saturations>88%;To learn and demonstrate proper purse lipped breathing techniques or other breathing techniques.;To learn and demonstrate proper use of respiratory medications     Long  Term Goals  - Exhibits compliance with exercise, home and travel O2 prescription;Maintenance of O2 saturations>88%;Compliance with respiratory medication;Verbalizes importance of monitoring SPO2 with pulse oximeter and return demonstration;Exhibits proper breathing techniques, such as purse lipped breathing or other method  taught during program session;Demonstrates proper use of MDI's Compliance with respiratory medication;Maintenance of O2 saturations>88%;Exhibits proper breathing techniques, such as purse lipped breathing or other method taught during program session;Demonstrates proper use of MDI's;Verbalizes importance of monitoring SPO2 with pulse oximeter and return demonstration     Comments  - Patient has been doing well with his O2 saturations since he has been in the program. Patient feels like he needs to work on losing weight. Bradley Barnes's O2 saturation has not dropped below 90% while in the program. He is checking his oxygen at home with his pulse ox regularly. He is taking his repiratory medications with a spacer. Bradley Barnes states he still wears his CPAP everytime he sleeps.     Goals/Expected Outcomes  - Short term: Understand when to check and the importance of monitoring O2 saturation. Long Term: use proper breathing techniques and monitor his own O2 saturation.  Short: continue to work on proper breathing techniques. Long. Be proficient in PLB techniques.        Oxygen Discharge (Final Oxygen Re-Evaluation):     Oxygen Re-Evaluation - 08/29/16 1243      Program Oxygen Prescription   Program Oxygen Prescription None  Home Oxygen   Home Oxygen Device None   Sleep Oxygen Prescription None   Home Exercise Oxygen Prescription None   Home at Rest Exercise Oxygen Prescription None     Goals/Expected Outcomes   Short Term Goals To learn and understand importance of monitoring SPO2 with pulse oximeter and demonstrate accurate use of the pulse oximeter.;To Learn and understand importance of maintaining oxygen saturations>88%;To learn and demonstrate proper purse lipped breathing techniques or other breathing techniques.;To learn and demonstrate proper use of respiratory medications   Long  Term Goals Compliance with respiratory medication;Maintenance of O2 saturations>88%;Exhibits proper breathing techniques,  such as purse lipped breathing or other method taught during program session;Demonstrates proper use of MDI's;Verbalizes importance of monitoring SPO2 with pulse oximeter and return demonstration   Comments Bradley Barnes's O2 saturation has not dropped below 90% while in the program. He is checking his oxygen at home with his pulse ox regularly. He is taking his repiratory medications with a spacer. Bradley Barnes states he still wears his CPAP everytime he sleeps.   Goals/Expected Outcomes Short: continue to work on proper breathing techniques. Long. Be proficient in PLB techniques.      Initial Exercise Prescription:     Initial Exercise Prescription - 06/25/16 1400      Date of Initial Exercise RX and Referring Provider   Date 06/25/16   Referring Provider Ramonita Lab MD     Treadmill   MPH 1.1   Grade 0   Minutes 15   METs 1.84     Bike   Level 0.5   Watts 10   Minutes 15   METs 1.5     T5 Nustep   Level 1   SPM 80   Minutes 15   METs 1.5     Track   Laps 15   Minutes 15   METs 1.7     Prescription Details   Frequency (times per week) 3   Duration Progress to 45 minutes of aerobic exercise without signs/symptoms of physical distress     Intensity   THRR 40-80% of Max Heartrate 91-127   Ratings of Perceived Exertion 11-13   Perceived Dyspnea 0-4     Progression   Progression Continue to progress workloads to maintain intensity without signs/symptoms of physical distress.     Resistance Training   Training Prescription Yes   Weight 3 lbs   Reps 10-15      Perform Capillary Blood Glucose checks as needed.  Exercise Prescription Changes:     Exercise Prescription Changes    Row Name 07/03/16 1200 07/23/16 1300 08/02/16 1300 08/07/16 1500 08/23/16 0900     Response to Exercise   Blood Pressure (Admit) 102/64 112/62  - 124/72 126/68   Blood Pressure (Exercise) 146/64 120/70  - 142/84 142/70   Blood Pressure (Exit) 104/58 102/60  - 132/76 132/74   Heart Rate (Admit) 56  bpm 57 bpm  - 61 bpm 54 bpm   Heart Rate (Exercise) 64 bpm 60 bpm  - 65 bpm 64 bpm   Heart Rate (Exit) 54 bpm 50 bpm  - 50 bpm 54 bpm   Oxygen Saturation (Admit) 100 % 98 %  - 98 % 98 %   Oxygen Saturation (Exercise) 99 % 198 %  - 97 % 97 %   Oxygen Saturation (Exit) 100 % 98 %  - 99 % 98 %   Rating of Perceived Exertion (Exercise) 14 14  - 14 14   Perceived Dyspnea (Exercise) 2 2.5  -  2 2   Duration Progress to 45 minutes of aerobic exercise without signs/symptoms of physical distress Progress to 45 minutes of aerobic exercise without signs/symptoms of physical distress  - Continue with 45 min of aerobic exercise without signs/symptoms of physical distress. Continue with 45 min of aerobic exercise without signs/symptoms of physical distress.   Intensity THRR unchanged THRR unchanged  - THRR unchanged Other (comment)  PM and meds keep Bradley Barnes from attaining THR     Progression   Progression Continue to progress workloads to maintain intensity without signs/symptoms of physical distress. Continue to progress workloads to maintain intensity without signs/symptoms of physical distress.  - Continue to progress workloads to maintain intensity without signs/symptoms of physical distress. Continue to progress workloads to maintain intensity without signs/symptoms of physical distress.   Average METs  - 2.38  - 3.28 1.8     Resistance Training   Training Prescription Yes Yes  - Yes Yes   Weight 4 3  - 3 4   Reps 10-15 10-15  - 10-15 10-15   Time  -  -  - 2.29 Minutes  -     Interval Training   Interval Training  - No  - No No     Oxygen   Liters  - 0  -  -  -     Bike   Level 0.5  -  -  -  -   Watts 10 31  - 50 50   Minutes 15 15  - 15 15   METs  - 2.76  - 3.28 3.28     T5 Nustep   Level 1 2  -  - 3   SPM 103 95  -  - 74   Minutes 15 15  -  - 15   METs 2.1 2  -  - 1.9     Track   Laps  -  -  - 20 17   Minutes  -  -  - 15 15   METs  -  -  - 1.92 1.77     Home Exercise Plan   Plans  to continue exercise at  -  - [employer] (comment)  walk at [place]  -  -   Frequency  -  - Add 2 additional days to program exercise sessions.  -  -   Initial Home Exercises Provided  -  - 08/02/16  -  -   [place] Name 09/04/16 1200 09/18/16 1200 10/02/16 1200         Response to Exercise   Blood Pressure (Admit) 125/56 134/64 122/78     Blood Pressure (Exit) 138/62 124/74 126/68     Heart Rate (Admit) 54 bpm 59 bpm 55 bpm     Heart Rate (Exercise) 61 bpm 64 bpm 67 bpm     Heart Rate (Exit) 58 bpm 54 bpm 98 bpm     Oxygen Saturation (Admit) 97 % 97 % 97 %     Oxygen Saturation (Exercise) 98 % 98 % 98 %     Oxygen Saturation (Exit) 97 % 98 % 98 %     Rating of Perceived Exertion (Exercise) _0 Perceived Dyspnea (Exercise) _1 Duration Continue with 45 min of aerobic exercise without signs/symptoms of physical distress. Continue with 45 min of aerobic exercise without signs/symptoms of physical distress. Continue with 45 min of aerobic exercise without signs/symptoms of  physical distress.     Intensity Other (comment)  pacemaker Other (comment) Other (comment)  PM       Progression   Progression Continue to progress workloads to maintain intensity without signs/symptoms of physical distress. Continue to progress workloads to maintain intensity without signs/symptoms of physical distress. Continue to progress workloads to maintain intensity without signs/symptoms of physical distress.     Average METs 2.75 2.5  -       Resistance Training   Training Prescription Yes Yes  -     Weight 4 4  -     Reps 10-15 10-15  -       Interval Training   Interval Training No No  -       Bike   Watts 58 63 50     Minutes _0 METs 3.57 3.6 3.28       T5 Nustep   Level  - 3  -     SPM  - 60  -     Minutes  - 15  -     METs  - 2  -       Track   Laps _1 Minutes _2 METs 1.92 2.15 2        Exercise Comments:     Exercise  Comments    Row Name 07/03/16 1247           Exercise Comments First full day of exercise!  Patient was oriented to gym and equipment including functions, settings, policies, and procedures.  Patient's individual exercise prescription and treatment plan were reviewed.  All starting workloads were established based on the results of the 6 minute walk test done at initial orientation visit.  The plan for exercise progression was also introduced and progression will be customized based on patient's performance and goals.          Exercise Goals and Review:     Exercise Goals    Row Name 06/25/16 1428             Exercise Goals   Increase Physical Activity Yes       Intervention Provide advice, education, support and counseling about physical activity/exercise needs.;Develop an individualized exercise prescription for aerobic and resistive training based on initial evaluation findings, risk stratification, comorbidities and participant's personal goals.       Expected Outcomes Achievement of increased cardiorespiratory fitness and enhanced flexibility, muscular endurance and strength shown through measurements of functional capacity and personal statement of participant.       Increase Strength and Stamina Yes       Intervention Provide advice, education, support and counseling about physical activity/exercise needs.;Develop an individualized exercise prescription for aerobic and resistive training based on initial evaluation findings, risk stratification, comorbidities and participant's personal goals.       Expected Outcomes Achievement of increased cardiorespiratory fitness and enhanced flexibility, muscular endurance and strength shown through measurements of functional capacity and personal statement of participant.          Exercise Goals Re-Evaluation :     Exercise Goals Re-Evaluation    Row Name 07/23/16 1319 08/02/16 1346 08/07/16 1517 08/23/16 0936 09/04/16 1237      Exercise Goal Re-Evaluation   Exercise Goals Review Increase Physical Activity;Increase Strenth and Stamina Increase Physical Activity;Increase Strenth and Stamina Increase Physical Activity;Increase Strenth and Stamina Increase Physical Activity;Increase Strenth and Stamina Increase  Physical Activity;Increase Strenth and Stamina   Comments Bradley Barnes is tolerating exercise well so far.    Reviewed home exercise with pt today.  Pt plans to walk  for exercise.  Reviewed THR, pulse, RPE, sign and symptoms, NTG use, and when to call 911 or MD.  Also discussed weather considerations and indoor options.  Pt voiced understanding. Bradley Barnes is increasing his MET level overall.  He is walking at the [facility] on days he doesnt attend  LW. Bradley Barnes's pacemaker and medications keep him from reaching his target HR.  His RPE indicates he is working at a proper level. Bradley Barnes has inceased his overall MET level slowly.  He is also walking on days he doesnt attend LW.   Expected Outcomes Short- Bradley Barnes will increase his stamina for ADLs.  Long - Bradley Barnes will continue to exercise regularly outside of LW. Short - pt will continue to exercise on days not at [place] - Pt will cotinue to exercise after completing the program. Short - Bradley Barnes will continue regular attendance at [facility].  Long - Bradley Barnes will graduate and exercise on his own. Short - Bradley Barnes will continue to exercise in LW.  Long - Bradley Barnes will complete LW and exercise on his own. Short - Bradley Barnes will continue to exercise outside LW class.  Long - Bradley Barnes will continue to exercise on his own after completing LW.   [place] Name 09/18/16 1250 10/02/16 1243           Exercise Goal Re-Evaluation   Exercise Goals Review Increase Physical Activity;Increase Strength and Stamina;Able to understand and use rate of perceived exertion (RPE) scale;Able to understand and use Dyspnea scale Increase Physical Activity;Increase Strength and Stamina      Comments Bradley Barnes has been able to walk more laps at the [employer] [facility].  He is progressing well with exercise. Bradley Barnes is stil walking at [facility] when possible outside of class.      Expected Outcomes Short - Bradley Barnes will continue to increase his walking.  Long - Bradley Barnes will continue exercise on his own. Short - Bradley Barnes will continue to exercise in class and on his own.  Long - Bradley Barnes will maintain exercise on his own.         Discharge Exercise Prescription (Final Exercise Prescription Changes):     Exercise Prescription Changes - 10/02/16 1200      Response to Exercise   Blood Pressure (Admit) 122/78   Blood Pressure (Exit) 126/68   Heart Rate (Admit) 55 bpm   Heart Rate (Exercise) 67 bpm   Heart Rate (Exit) 98 bpm   Oxygen Saturation (Admit) 97 %   Oxygen Saturation (Exercise) 98 %   Oxygen Saturation (Exit) 98 %   Rating of Perceived Exertion (Exercise) 14   Perceived Dyspnea (Exercise) 3   Duration Continue with 45 min of aerobic exercise without signs/symptoms of physical distress.   Intensity Other (comment)  PM     Progression   Progression Continue to progress workloads to maintain intensity without signs/symptoms of physical distress.     Bike   Watts 50   Minutes 15   METs 3.28     Track   Laps 22   Minutes 15   METs 2      Nutrition:  Target Goals: Understanding of nutrition guidelines, daily intake of sodium <1584m, cholesterol <2080m calories 30% from fat and 7% or less from saturated fats, daily to have 5 or more servings of fruits and vegetables.  Biometrics:     Pre Biometrics - 06/25/16 1428      Pre Biometrics   Height _0  (1.88 m)   Weight 271 lb 3.2 oz (123 kg)   Waist Circumference 52 inches   Hip Circumference 44 inches   Waist to Hip Ratio 1.18 %   BMI (Calculated) 34.9       Nutrition Therapy Plan and Nutrition Goals:     Nutrition Therapy & Goals - 08/05/16 1527      Nutrition Therapy   RD appointment defered Yes     Personal Nutrition Goals   Comments patient states his Wife is  a nutritionist     Intervention Plan   Intervention Nutrition handout(s) given to patient.   Expected Outcomes Long Term Goal: Adherence to prescribed nutrition plan.;Short Term Goal: Understand basic principles of dietary content, such as calories, fat, sodium, cholesterol and nutrients.      Nutrition Discharge: Rate Your Plate Scores:   Nutrition Goals Re-Evaluation:     Nutrition Goals Re-Evaluation    Row Name 08/29/16 1250             Goals   Current Weight 267 lb (121.1 kg)       Comment When asked to meet with the Dietician he states his wife was a nutritionist and she helps him.        Expected Outcome Short: lose weight by dieting better at home with the help of his wife. Long: Maintain a healthy diet for weight loss          Nutrition Goals Discharge (Final Nutrition Goals Re-Evaluation):     Nutrition Goals Re-Evaluation - 08/29/16 1250      Goals   Current Weight 267 lb (121.1 kg)   Comment When asked to meet with the Dietician he states his wife was a nutritionist and she helps him.    Expected Outcome Short: lose weight by dieting better at home with the help of his wife. Long: Maintain a healthy diet for weight loss      Psychosocial: Target Goals: Acknowledge presence or absence of significant depression and/or stress, maximize coping skills, provide positive support system. Participant is able to verbalize types and ability to use techniques and skills needed for reducing stress and depression.   Initial Review & Psychosocial Screening:     Initial Psych Review & Screening - 06/25/16 [place]? Yes   Comments Bradley Barnes has good support from his wife and children. 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 and ICU hospitalization in June. Bradley Barnes is looking forward to supervised exercise and learning more about his COPD.     Barriers   Psychosocial barriers to participate in program There  are no identifiable barriers or psychosocial needs.;The patient should benefit from training in stress management and relaxation.     Screening Interventions   Interventions Encouraged to exercise      Quality of Life Scores:     Quality of Life - 06/25/16 1540      Quality of Life Scores   Health/Function Pre 20.06 %   Socioeconomic Pre 19.83 %   Psych/Spiritual Pre 20.33 %   Family Pre 21 %   GLOBAL Pre 20.19 %      PHQ-9: Recent Review Flowsheet Data    Depression screen [facility] 2/9 06/25/2016 09/26/2015 08/07/2015   Decreased Interest _1 Down,  Depressed, Hopeless 0 0 0   PHQ - 2 Score _0 Altered sleeping _1 Tired, decreased energy _2 Change in appetite 1 2 0   Feeling bad or failure about yourself  0 0 0   Trouble concentrating _3 Moving slowly or fidgety/restless 0 1 0   Suicidal thoughts 0 0 0   PHQ-9 Score _4 Difficult doing work/chores Very difficult Somewhat difficult Somewhat difficult     Interpretation of Total Score  Total Score Depression Severity:  1-4 = Minimal depression, 5-9 = Mild depression, 10-14 = Moderate depression, 15-19 = Moderately severe depression, 20-27 = Severe depression   Psychosocial Evaluation and Intervention:     Psychosocial Evaluation - 07/22/16 1113      Psychosocial Evaluation & Interventions   Comments Bradley. Vanwyk has returned this year and now qualifies for the Pulmonary Rehab program.  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  He "coded" twice in 2022-02-23 according to his report and his Dr has encouraged him to come for rehabilitation.  Bradley. [facility]) has a strong support system with a spouse of 24 years, (that he calls his "gift") and a daughter who lives locally.  He is also actively involved in his local church and a men's prayer group.  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  He uses a CPAP  to sleep, which is helpful and he, and states his appetite is "too good!"  Bradley Barnes denies a history of depression or anxiety or current symptoms.  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  But he saw a psychiatrist and a pastor to help him through this.  Bradley Barnes's mood is generally positive and he reports other than his health, he has minimal stress in his life.  He has goals to lose wieght; increase his stamina and strength, and improve his shortness of breath.  Staff will be following Bradley Barnes throughout the course of this program.     Expected Outcomes Bradley Barnes will benefit from consistent exercise to achieve his stated goals.  He also should meet with the dietician to address his weight loss goal.  With the stress in Bradley Barnes's life due to multiple health issues, attending the psychoeducational components of this program will be helpful as well to refresh his coping strategies.     Continue Psychosocial Services  Follow up required by staff      Psychosocial Re-Evaluation:     Psychosocial Re-Evaluation    [place] Name 07/15/16 1428 08/05/16 1530 08/19/16 1117 08/28/16 1128 08/29/16 1253     Psychosocial Re-Evaluation   Current issues with  - None Identified  -  - None Identified   Comments Bradley Barnes has attended [facility] 3 sessions. He is still nervous about not wearing a heart monitor due to his past heart codes. He does have a strong [place] faith and has had several people praying for him which has helped the most with his anxiety of another cardiac episode.  He also worries that he is a burden to his wife, since Bradley Laban can not drive and she has her own health conditions.  Bradley. Barnes seems reserved about his feelings but has been talking about his  activities of his daily life. Its healthy for Bradley. Barnes to talk about his daily life to develope better communication between staff and himself. Counselor follow  with Bradley Barnes today reporting he is noticing some improvement with his stamina as he walks on his "off days" here and states he can go further and longer than before coming into this program.  He continues to sleep okay and his mood is Barnes - although he went to a funeral of a 20 year old friend this past weekend and was sad about this today.  Bradley Barnes states his stress currently is his spouse's new dog who is so big and is "dragging her" when she walks him - which concerns him for his spouse's safety.  Discussed ways to cope with this and his recent loss.  Counselor commended Engineer, technical sales on his progress made and commitment to consistent exercise.  Counselor follow up with Bradley Barnes today reporting the education portion of this program has been tremendously helpful in learning how to breathe better; what is COPD; and how to manage his disease in a more positive way.  Counselor commended Engineer, technical sales on his participation in every aspect of this program.    -   Expected Outcomes Short: continue exercising and learning from the health lectures. Long term: in 3 months, Bradley Barnes will be driving and will share responsibilities with home activities. Short Term: Continue to exercise and have a positive outlook. Long Term: Bradley. Barnes would like to do more things at home such as read, play solitare, and chores. Bradley Barnes will benefit from consistent exercise and reminders of positive ways to cope with stress in his life currently.  - Continue to exercise and stay positive with any stress he may have   Interventions Encouraged to attend Pulmonary Rehabilitation for the exercise;Relaxation education;Stress management education Encouraged to attend Pulmonary Rehabilitation for the exercise Relaxation education;Stress management education  - Encouraged to attend Pulmonary Rehabilitation for the exercise   Continue Psychosocial Services  Follow up required by counselor Follow up required by staff Follow up required by staff  - Follow up required by  staff   [place] Name 09/25/16 1116             Psychosocial Re-Evaluation   Comments Counselor follow up today with Bradley Barnes reporting he has made some progress with some weight loss and having more stamina since coming into this program.  He continues to sleep well and is working on increased balance with a Physical Therapist who gave him some exercises to do.  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  Counselor processeed with Bradley Barnes some coping strategies he is using and encouraged use of mindfulness techniques to stay in the moments and not in the past or future.  He is in a positive mood and counselor commended him for the progress he has made since coming into this program.         Expected Outcomes Bradley Barnes will continue to work on his balance and exercise consistently.  He will also use stress management techniques discussed to cope better with the recent stressors he is experiencing.         Interventions Stress management education          Psychosocial Discharge (Final Psychosocial Re-Evaluation):     Psychosocial Re-Evaluation - 09/25/16 1116      Psychosocial Re-Evaluation   Comments Counselor follow up today with Bradley Barnes reporting he has made some progress with  some weight loss and having more stamina since coming into this program.  He continues to sleep well and is working on increased balance with a Physical Therapist who gave him some exercises to do.  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  Counselor processeed with Bradley Barnes some coping strategies he is using and encouraged use of mindfulness techniques to stay in the moments and not in the past or future.  He is in a positive mood and counselor commended him for the progress he has made since coming into this program.     Expected Outcomes Bradley Barnes will continue to work on his balance and exercise consistently.   He will also use stress management techniques discussed to cope better with the recent stressors he is experiencing.     Interventions Stress management education      Education: Education Goals: Education classes will be provided on a weekly basis, covering required topics. Participant will state understanding/return demonstration of topics presented.  Learning Barriers/Preferences:     Learning Barriers/Preferences - 06/25/16 1517      Learning Barriers/Preferences   Learning Barriers Exercise Concerns   Learning Preferences None      Education Topics: Initial Evaluation Education: - Verbal, written and demonstration of respiratory meds, RPE/PD scales, oximetry and breathing techniques. Instruction on use of nebulizers and MDIs: cleaning and proper use, rinsing mouth with steroid doses and importance of monitoring MDI activations.   Pulmonary Rehab from 10/11/2016 in [facility] Cardiac and Pulmonary Rehab  Date  06/25/16  Educator  LB  Instruction Review Code (retired)  2- meets goals/outcomes      General Nutrition Guidelines/Fats and Fiber: -Group instruction provided by verbal, written material, models and posters to present the general guidelines for heart healthy nutrition. Gives an explanation and review of dietary fats and fiber.   Cardiac Rehab from 10/31/2015 in [facility] Cardiac and Pulmonary Rehab  Date  10/10/15  Educator  CR  Instruction Review Code (retired)  R- Review/reinforce [Second Class]      Controlling Sodium/Reading Food Labels: -Group verbal and written material supporting the discussion of sodium use in heart healthy nutrition. Review and explanation with models, verbal and written materials for utilization of the food label.   Cardiac Rehab from 10/31/2015 in [facility] Cardiac and Pulmonary Rehab  Date  08/22/15  Educator  Connye Burkitt  Instruction Review Code (retired)  2- meets goals/outcomes      Exercise Physiology & Risk Factors: - Group verbal and  written instruction with models to review the exercise physiology of the cardiovascular system and associated critical values. Details cardiovascular disease risk factors and the goals associated with each risk factor.   Cardiac Rehab from 10/31/2015 in [facility] Cardiac and Pulmonary Rehab  Date  10/24/15  Educator  [facility]  Instruction Review Code (retired)  2- meets goals/outcomes      Aerobic Exercise & Resistance Training: - Gives group verbal and written discussion on the health impact of inactivity. On the components of aerobic and resistive training programs and the benefits of this training and how to safely progress through these programs.   Cardiac Rehab from 10/31/2015 in [facility] Cardiac and Pulmonary Rehab  Date  10/26/15  Educator  [facility]  Instruction Review Code (retired)  2- Biomedical scientist, Balance, General Exercise Guidelines: - Provides group verbal and written instruction on the benefits of flexibility and balance training programs. Provides general exercise guidelines  with specific guidelines to those with heart or lung disease. Demonstration and skill practice provided.   Pulmonary Rehab from 10/11/2016 in [facility] Cardiac and Pulmonary Rehab  Date  09/04/16  Educator  AS  Instruction Review Code  1- Verbalizes Understanding      Stress Management: - Provides group verbal and written instruction about the health risks of elevated stress, cause of high stress, and healthy ways to reduce stress.   Cardiac Rehab from 10/31/2015 in [facility] Cardiac and Pulmonary Rehab  Date  09/07/15  Educator  CE  Instruction Review Code (retired)  2- meets goals/outcomes      Depression: - Provides group verbal and written instruction on the correlation between heart/lung disease and depressed mood, treatment options, and the stigmas associated with seeking treatment.   Pulmonary Rehab from 10/11/2016 in [facility] Cardiac and Pulmonary Rehab  Instruction Review Code (retired)  Not  applicable [Patient states he already had]  Instruction Review Code  5- Refused Teaching      Exercise & Equipment Safety: - Individual verbal instruction and demonstration of equipment use and safety with use of the equipment.   Infection Prevention: - Provides verbal and written material to individual with discussion of infection control including proper hand washing and proper equipment cleaning during exercise session.   Falls Prevention: - Provides verbal and written material to individual with discussion of falls prevention and safety.   Diabetes: - Individual verbal and written instruction to review signs/symptoms of diabetes, desired ranges of glucose level fasting, after meals and with exercise. Advice that pre and post exercise glucose checks will be done for 3 sessions at entry of program.   Chronic Lung Diseases: - Group verbal and written instruction to review new updates, new respiratory medications, new advancements in procedures and treatments. Provide informative websites and "800" numbers of self-education.   Pulmonary Rehab from 10/11/2016 in [facility] Cardiac and Pulmonary Rehab  Date  08/07/16  Educator  [facility]  Instruction Review Code  1- Verbalizes Understanding      Lung Procedures: - Group verbal and written instruction to describe testing methods done to diagnose lung disease. Review the outcome of test results. Describe the treatment choices: Pulmonary Function Tests, ABGs and oximetry.   Energy Conservation: - Provide group verbal and written instruction for methods to conserve energy, plan and organize activities. Instruct on pacing techniques, use of adaptive equipment and posture/positioning to relieve shortness of breath.   Pulmonary Rehab from 10/11/2016 in [facility] Cardiac and Pulmonary Rehab  Date  07/03/16  Educator  [facility]  Instruction Review Code  1- Verbalizes Understanding      Triggers: - Group verbal and written instruction to review types of  environmental controls: home humidity, furnaces, filters, dust mite/pet prevention, HEPA vacuums. To discuss weather changes, air quality and the benefits of nasal washing.   Exacerbations: - Group verbal and written instruction to provide: warning signs, infection symptoms, calling MD promptly, preventive modes, and value of vaccinations. Review: effective airway clearance, coughing and/or vibration techniques. Create an Sports administrator.   Oxygen: - Individual and group verbal and written instruction on oxygen therapy. Includes supplement oxygen, available portable oxygen systems, continuous and intermittent flow rates, oxygen safety, concentrators, and Medicare reimbursement for oxygen.   Respiratory Medications: - Group verbal and written instruction to review medications for lung disease. Drug class, frequency, complications, importance of spacers, rinsing mouth after steroid MDI's, and proper cleaning methods for nebulizers.   Pulmonary Rehab from 10/11/2016 in [facility] Cardiac and Pulmonary Rehab  Date  06/25/16  Educator  LB  Instruction Review Code (retired)  2- meets goals/outcomes      AED/CPR: - Group verbal and written instruction with the use of models to demonstrate the basic use of the AED with the basic ABC's of resuscitation.   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 Applies individual practice skills.   Pulmonary Rehab from 10/11/2016 in [facility] Cardiac and Pulmonary Rehab  Date  06/25/16  Educator  LB  Instruction Review Code  2- Demonstrated Understanding      Anatomy and Physiology of the Lungs: - Group verbal and written instruction with the use of models to provide  basic lung anatomy and physiology related to function, structure and complications of lung disease.   Pulmonary Rehab from 10/11/2016 in [facility] Cardiac and Pulmonary Rehab  Date  09/25/16  Educator  [facility]  Instruction Review Code  1- Verbalizes Understanding      Anatomy & Physiology of the Heart: - Group verbal and written instruction and models provide basic cardiac anatomy and physiology, with the coronary electrical and arterial systems. Review of: AMI, Angina, Valve disease, Heart Failure, Cardiac Arrhythmia, Pacemakers, and the ICD.   Cardiac Rehab from 10/31/2015 in [facility] Cardiac and Pulmonary Rehab  Date  09/12/15  Educator  SB  Instruction Review Code (retired)  2- meets goals/outcomes      Heart Failure: - Group verbal and written instruction on the basics of heart failure: signs/symptoms, treatments, explanation of ejection fraction, enlarged heart and cardiomyopathy.   Sleep Apnea: - Individual verbal and written instruction to review Obstructive Sleep Apnea. Review of risk factors, methods for diagnosing and types of masks and machines for OSA.   Pulmonary Rehab from 10/11/2016 in [facility] Cardiac and Pulmonary Rehab  Date  06/25/16  Educator  LB  Instruction Review Code  1- Verbalizes Understanding      Anxiety: - Provides group, verbal and written instruction on the correlation between heart/lung disease and anxiety, treatment options, and management of anxiety.   Relaxation: - Provides group, verbal and written instruction about the benefits of relaxation for patients with heart/lung disease. Also provides patients with examples of relaxation techniques.   Pulmonary Rehab from 10/11/2016 in [facility] Cardiac and Pulmonary Rehab  Date  08/14/16  Educator  [facility]  Instruction Review Code  1- Verbalizes Understanding      Cardiac Medications: - Group verbal and written instruction to review commonly prescribed medications for heart disease. Reviews the medication, class of the  drug, and side effects.   Cardiac Rehab from 10/31/2015 in [facility] Cardiac and Pulmonary Rehab  Date  09/21/15  Educator  CE  Instruction Review Code (retired)  2- meets goals/outcomes      Know Your Numbers: -Group verbal and written instruction about important numbers in your health.  Review of Cholesterol, Blood Pressure, Diabetes, and BMI and the role they play in your overall health.   Other: -Provides group and verbal instruction on various topics (see comments)    Knowledge Questionnaire Score:     Knowledge Questionnaire Score - 06/25/16 1517      Knowledge Questionnaire Score   Pre Score 10/10       Core Components/Risk Factors/Patient Goals at  Admission:     Personal Goals and Risk Factors at Admission - 06/25/16 1522      Core Components/Risk Factors/Patient Goals on Admission    Weight Management Yes;Weight Loss   Intervention Weight Management: Develop a combined nutrition and exercise program designed to reach desired caloric intake, while maintaining appropriate intake of nutrient and fiber, sodium and fats, and appropriate energy expenditure required for the weight goal.;Weight Management: Provide education and appropriate resources to help participant work on and attain dietary goals.;Weight Management/Obesity: Establish reasonable short term and long term weight goals.;Obesity: Provide education and appropriate resources to help participant work on and attain dietary goals.   Admit Weight 271 lb 3.2 oz (123 kg)   Goal Weight: Short Term 266 lb (120.7 kg)   Goal Weight: Long Term 220 lb (99.8 kg)   Expected Outcomes Long Term: Adherence to nutrition and physical activity/exercise program aimed toward attainment of established weight goal;Short Term: Continue to assess and modify interventions until short term weight is achieved;Weight Maintenance: Understanding of the daily nutrition guidelines, which includes 25-35% calories from fat, 7% or less cal from saturated  fats, less than 213m cholesterol, less than 1.5gm of sodium, & 5 or more servings of fruits and vegetables daily;Weight Loss: Understanding of general recommendations for a balanced deficit meal plan, which promotes 1-2 lb weight loss per week and includes a negative energy balance of [phone] kcal/d;Understanding recommendations for meals to include 15-35% energy as protein, 25-35% energy from fat, 35-60% energy from carbohydrates, less than 2035mof dietary cholesterol, 20-35 gm of total fiber daily;Understanding of distribution of calorie intake throughout the day with the consumption of 4-5 meals/snacks   Improve shortness of breath with ADL's Yes   Intervention Provide education, individualized exercise plan and daily activity instruction to help decrease symptoms of SOB with activities of daily living.   Expected Outcomes Short Term: Achieves a reduction of symptoms when performing activities of daily living.   Develop more efficient breathing techniques such as purse lipped breathing and diaphragmatic breathing; and practicing self-pacing with activity Yes   Intervention Provide education, demonstration and support about specific breathing techniuqes utilized for more efficient breathing. Include techniques such as pursed lipped breathing, diaphragmatic breathing and self-pacing activity.   Expected Outcomes Short Term: Participant will be able to demonstrate and use breathing techniques as needed throughout daily activities.   Increase knowledge of respiratory medications and ability to use respiratory devices properly  Yes  Anoro E and Albuterol MDI; spacer given   Intervention Provide education and demonstration as needed of appropriate use of medications, inhalers, and oxygen therapy.   Expected Outcomes Short Term: Achieves understanding of medications use. Understands that oxygen is a medication prescribed by physician. Demonstrates appropriate use of inhaler and oxygen therapy.   Heart  Failure Yes   Intervention Provide a combined exercise and nutrition program that is supplemented with education, support and counseling about heart failure. Directed toward relieving symptoms such as shortness of breath, decreased exercise tolerance, and extremity edema.   Expected Outcomes Improve functional capacity of life;Short term: Attendance in program 2-3 days a week with increased exercise capacity. Reported lower sodium intake. Reported increased fruit and vegetable intake. Reports medication compliance.;Short term: Daily weights obtained and reported for increase. Utilizing diuretic protocols set by physician.;Long term: Adoption of self-care skills and reduction of barriers for early signs and symptoms recognition and intervention leading to self-care maintenance.   Hypertension Yes   Intervention Provide education on lifestyle modifcations including regular physical  activity/exercise, weight management, moderate sodium restriction and increased consumption of fresh fruit, vegetables, and low fat dairy, alcohol moderation, and smoking cessation.;Monitor prescription use compliance.   Expected Outcomes Short Term: Continued assessment and intervention until BP is < 140/20m HG in hypertensive participants. < 130/851mHG in hypertensive participants with diabetes, heart failure or chronic kidney disease.;Long Term: Maintenance of blood pressure at goal levels.   Lipids Yes   Intervention Provide education and support for participant on nutrition & aerobic/resistive exercise along with prescribed medications to achieve LDL '70mg'$ , HDL >'40mg'$ .   Expected Outcomes Short Term: Participant states understanding of desired cholesterol values and is compliant with medications prescribed. Participant is following exercise prescription and nutrition guidelines.;Long Term: Cholesterol controlled with medications as prescribed, with individualized exercise RX and with personalized nutrition plan. Value goals:  LDL < '70mg'$ , HDL > 40 mg.      Core Components/Risk Factors/Patient Goals Review:      Goals and Risk Factor Review    Row Name 07/15/16 1419 08/05/16 1517 08/29/16 1219 09/18/16 1416 10/02/16 1245     Core Components/Risk Factors/Patient Goals Review   Personal Goals Review Weight Management/Obesity;Develop more efficient breathing techniques such as purse lipped breathing and diaphragmatic breathing and practicing self-pacing with activity.;Improve shortness of breath with ADL's Weight Management/Obesity;Improve shortness of breath with ADL's;Develop more efficient breathing techniques such as purse lipped breathing and diaphragmatic breathing and practicing self-pacing with activity. Weight Management/Obesity;Improve shortness of breath with ADL's;Increase knowledge of respiratory medications and ability to use respiratory devices properly.;Lipids;Hypertension;Develop more efficient breathing techniques such as purse lipped breathing and diaphragmatic breathing and practicing self-pacing with activity. Weight Management/Obesity;Improve shortness of breath with ADL's;Lipids;Hypertension;Develop more efficient breathing techniques such as purse lipped breathing and diaphragmatic breathing and practicing self-pacing with activity.;Increase knowledge of respiratory medications and ability to use respiratory devices properly. Weight Management/Obesity;Improve shortness of breath with ADL's;Heart Failure;Other;Hypertension   Review Bradley Barnes completed 3 sessions of [facility]. He is cued to do PLB and does have shortness of breath with most activity. He would like to mow at home, but with the heat and shortness of breath, he has not been able to mow, but he hopes to in the future [place] at 273lbs, does not want to meet with the dietitian at this time, and states his wife cooks healthy.[facility], even  on [facility] days. Bradley. Barnes been working hard the last few sessions pushing himself. He states some days are better than others with his shortness of breath but it has not been too bad. Bradley Barnes needs to work on his PLB techniques when he gets short of breath. His exercise has been going well but feels like he needs to improve more. His blood pressure has been Barnes. He is using his respiratory medications as prescribed with a spacer. His weight has not changed much since the start of the program and sometimes states he eats too much. Bradley Landmarkas been working on losing weight and he has lost about 5 pounds recently. He states he has been eating less and working with his wife to keep his weight off. He has been walking more laps in the gym.  Bradley Barnes states his weight was up today due to eating at a buffet while out of town.  He did not have any shortness of breath.  Other than eating out, Bradley Barnes he doesnt use salt at all. His BP has  been good in class.    Expected Outcomes Long term: Continue to attend [facility] to increase his stamina and conditioning to improve his home exercise; Short: Learn to use PLB without cueing. Long term: Continue to attend [facility] to increase his strength to improve his home exercise routine at the [facility]; Short: Learn to use PLB without cueing. Lose weight for easier breathing. Short: Work on PLB techniques. Lose more weight. Increase exercise routine. Long: use PLB without cueing. Increase his SOB and his ADL Short: Continue to lose weight and work on PLB techniques. Long: maintain weight loss and use PLB without cueing Short - Bradley Barnes will get back to original weight by returning to usual habits.  Long - Bradley Barnes will maintain healthy lifestyle choices.      Core Components/Risk Factors/Patient Goals at Discharge (Final Review):      Goals and Risk Factor Review - 10/02/16 1245      Core Components/Risk Factors/Patient Goals Review   Personal Goals Review Weight  Management/Obesity;Improve shortness of breath with ADL's;Heart Failure;Other;Hypertension   Review Bradley Barnes states his weight was up today due to eating at a buffet while out of town.  He did not have any shortness of breath.  Other than eating out, Bradley Barnes reports he doesnt use salt at all. His BP has been good in class.    Expected Outcomes Short - Bradley Barnes will get back to original weight by returning to usual habits.  Long - Bradley Barnes will maintain healthy lifestyle choices.      ITP Comments:     ITP Comments    Row Name 07/10/16 1306 07/22/16 0741 08/02/16 1345 08/19/16 0851 09/16/16 0816   ITP Comments Bradley Barnes has been absent due to other Dr appointments. 30 day note review with Medical Director of [place] Dr. Emily Filbert  Reviewed home exercise with pt today.  Pt plans to walk  for exercise.  Reviewed THR, pulse, RPE, sign and symptoms, NTG use, and when to call 911 or MD.  Also discussed weather considerations and indoor options.  Pt voiced understanding. 30 day review completed ITP sent to Dr. Ramonita Lab for Dr. Emily Filbert Director of [place]. Continue with ITP unless changes are made by physician.  30 day review completed. ITP sent to Dr. Emily Filbert Director of [place]. Continue with ITP unless changes are made by physician.     [place] Name 10/11/16 1019 10/14/16 0822         ITP Comments He will be out Monday and Friday next week 30 day review completed. ITP sent to Dr. Emily Filbert Director of [place]. Continue with ITP unless changes are made by physician.           Comments: 30 day review

## 2016-10-16 DIAGNOSIS — J449 Chronic obstructive pulmonary disease, unspecified: Secondary | ICD-10-CM

## 2016-10-16 NOTE — Progress Notes (Signed)
Daily Session Note  Patient Details  Name: Bradley Barnes. MRN: [id number] Date of Birth: 10/06/1940 Referring Provider:     Pulmonary Rehab from 06/25/2016 in [facility] Cardiac and Pulmonary Rehab  Referring Provider  Ramonita Lab MD      Encounter Date: 10/16/2016  Check In:     Session Check In - 10/16/16 1005      Check-In   Location [facility]-Cardiac & Pulmonary Rehab   Staff Present Alberteen Sam, MA, ACSM RCEP, Exercise Physiologist;Amanda Oletta Darter, BA, ACSM CEP, Exercise Physiologist;Joseph Flavia Shipper   Supervising physician immediately available to respond to emergencies [facility] immediately available ER MD   Physician(s) Dr. Burlene Arnt and Joni Fears   Medication changes reported     No   Fall or balance concerns reported    No   Warm-up and Cool-down Performed as group-led instruction   Resistance Training Performed Yes   VAD Patient? No     Pain Assessment   Currently in Pain? No/denies   Multiple Pain Sites No         History  Smoking Status  . Former Smoker  . Packs/day: 1.00  . Years: 33.00  . Types: Cigarettes  . Quit date: 10/03/1991  Smokeless Tobacco  . Never Used    Goals Met:  Independence with exercise equipment Exercise tolerated well No report of cardiac concerns or symptoms Strength training completed today  Goals Unmet:  Not Applicable  Comments: Pt able to follow exercise prescription today without complaint.  Will continue to monitor for progression.   Dr. Emily Filbert is Medical Director for [place] and [facility].

## 2016-10-18 DIAGNOSIS — J449 Chronic obstructive pulmonary disease, unspecified: Secondary | ICD-10-CM | POA: Diagnosis not present

## 2016-10-18 NOTE — Progress Notes (Signed)
Daily Session Note  Patient Details  Name: Bradley Barnes. MRN: [id number] Date of Birth: May 03, 1940 Referring Provider:     Pulmonary Rehab from 06/25/2016 in [facility] Cardiac and Pulmonary Rehab  Referring Provider  Ramonita Lab MD      Encounter Date: 10/18/2016  Check In:     Session Check In - 10/18/16 1029      Check-In   Location [facility]-Cardiac & Pulmonary Rehab   Staff Present Nada Maclachlan, BA, ACSM CEP, Exercise Physiologist;Krista Frederico Hamman, RN BSN;Parlee Amescua Flavia Shipper   Supervising physician immediately available to respond to emergencies [facility] immediately available ER MD   Physician(s) Dr. Mable Paris and Reita Cliche   Medication changes reported     No   Fall or balance concerns reported    No   Warm-up and Cool-down Performed as group-led instruction   Resistance Training Performed Yes   VAD Patient? No     Pain Assessment   Currently in Pain? No/denies   Multiple Pain Sites No         History  Smoking Status  . Former Smoker  . Packs/day: 1.00  . Years: 33.00  . Types: Cigarettes  . Quit date: 10/03/1991  Smokeless Tobacco  . Never Used    Goals Met:  Independence with exercise equipment Exercise tolerated well No report of cardiac concerns or symptoms Strength training completed today  Goals Unmet:  Not Applicable  Comments: Pt able to follow exercise prescription today without complaint.  Will continue to monitor for progression.   Dr. Emily Filbert is Medical Director for [place] and [facility] Rehabilitation.

## 2016-10-21 ENCOUNTER — Encounter: Payer: Medicare HMO | Attending: Internal Medicine

## 2016-10-21 DIAGNOSIS — J449 Chronic obstructive pulmonary disease, unspecified: Secondary | ICD-10-CM | POA: Insufficient documentation

## 2016-10-28 DIAGNOSIS — J449 Chronic obstructive pulmonary disease, unspecified: Secondary | ICD-10-CM

## 2016-10-28 NOTE — Progress Notes (Signed)
Daily Session Note  Patient Details  Name: Bradley Barnes. MRN: [id number] Date of Birth: 12-27-1940 Referring Provider:     Pulmonary Rehab from 06/25/2016 in [facility] Cardiac and Pulmonary Rehab  Referring Provider  Ramonita Lab MD      Encounter Date: 10/28/2016  Check In:     Session Check In - 10/28/16 0948      Check-In   Location [facility]-Cardiac & Pulmonary Rehab   Staff Present Nada Maclachlan, BA, ACSM CEP, Exercise Physiologist;Kelly Amedeo Plenty, BS, ACSM CEP, Exercise Physiologist;Jakiah Goree Flavia Shipper   Supervising physician immediately available to respond to emergencies [facility] immediately available ER MD   Physician(s) Dr. Jimmye Norman and [facility]   Medication changes reported     No   Fall or balance concerns reported    No   Warm-up and Cool-down Performed as group-led instruction   Resistance Training Performed Yes   VAD Patient? No     Pain Assessment   Currently in Pain? No/denies   Multiple Pain Sites No         History  Smoking Status  . Former Smoker  . Packs/day: 1.00  . Years: 33.00  . Types: Cigarettes  . Quit date: 10/03/1991  Smokeless Tobacco  . Never Used    Goals Met:  Independence with exercise equipment Exercise tolerated well No report of cardiac concerns or symptoms Strength training completed today  Goals Unmet:  Not Applicable  Comments: Pt able to follow exercise prescription today without complaint.  Will continue to monitor for progression.   Dr. Emily Filbert is Medical Director for [place] and [facility].

## 2016-10-30 DIAGNOSIS — J449 Chronic obstructive pulmonary disease, unspecified: Secondary | ICD-10-CM

## 2016-10-30 NOTE — Progress Notes (Signed)
Daily Session Note  Patient Details  Name: Bradley Barnes. MRN: [id number] Date of Birth: August 18, 1940 Referring Provider:     Pulmonary Rehab from 06/25/2016 in [facility] Cardiac and Pulmonary Rehab  Referring Provider  Ramonita Lab MD      Encounter Date: 10/30/2016  Check In:     Session Check In - 10/30/16 0955      Check-In   Location [facility]-Cardiac & Pulmonary Rehab   Staff Present Alberteen Sam, MA, ACSM RCEP, Exercise Physiologist;Amanda Oletta Darter, BA, ACSM CEP, Exercise Physiologist;Chimamanda Siegfried Flavia Shipper   Supervising physician immediately available to respond to emergencies [facility] immediately available ER MD   Physician(s) Dr. Jimmye Norman and Cinda Quest   Medication changes reported     No   Fall or balance concerns reported    No   Warm-up and Cool-down Performed as group-led instruction   Resistance Training Performed Yes   VAD Patient? No     Pain Assessment   Currently in Pain? No/denies   Multiple Pain Sites No         History  Smoking Status  . Former Smoker  . Packs/day: 1.00  . Years: 33.00  . Types: Cigarettes  . Quit date: 10/03/1991  Smokeless Tobacco  . Never Used    Goals Met:  Independence with exercise equipment Exercise tolerated well No report of cardiac concerns or symptoms Strength training completed today  Goals Unmet:  Not Applicable  Comments: Pt able to follow exercise prescription today without complaint.  Will continue to monitor for progression.   Dr. Emily Filbert is Medical Director for [place] and [facility].

## 2016-11-01 DIAGNOSIS — J449 Chronic obstructive pulmonary disease, unspecified: Secondary | ICD-10-CM

## 2016-11-01 NOTE — Progress Notes (Signed)
Daily Session Note  Patient Details  Name: Arlington Sigmund. MRN: [id number] Date of Birth: 02/23/1940 Referring Provider:     Pulmonary Rehab from 06/25/2016 in [facility] Cardiac and Pulmonary Rehab  Referring Provider  Ramonita Lab MD      Encounter Date: 11/01/2016  Check In:     Session Check In - 11/01/16 1021      Check-In   Location [facility]-Cardiac & Pulmonary Rehab   Staff Present Nada Maclachlan, BA, ACSM CEP, Exercise Physiologist;Adeyemi Hamad Alcus Dad, RN BSN   Supervising physician immediately available to respond to emergencies [facility] immediately available ER MD   Physician(s) Dr. Cinda Quest and Clearnce Hasten   Medication changes reported     No   Fall or balance concerns reported    No   Warm-up and Cool-down Performed as group-led instruction   Resistance Training Performed Yes   VAD Patient? No     Pain Assessment   Currently in Pain? No/denies   Multiple Pain Sites No         History  Smoking Status  . Former Smoker  . Packs/day: 1.00  . Years: 33.00  . Types: Cigarettes  . Quit date: 10/03/1991  Smokeless Tobacco  . Never Used    Goals Met:  Independence with exercise equipment Exercise tolerated well No report of cardiac concerns or symptoms Strength training completed today  Goals Unmet:  Not Applicable  Comments: Pt able to follow exercise prescription today without complaint.  Will continue to monitor for progression.   Dr. Emily Filbert is Medical Director for [place] and [facility]monary Rehabilitation.

## 2016-11-06 DIAGNOSIS — J449 Chronic obstructive pulmonary disease, unspecified: Secondary | ICD-10-CM

## 2016-11-06 NOTE — Progress Notes (Signed)
Daily Session Note  Patient Details  Name: Bradley Barnes. MRN: [id number] Date of Birth: 1940/10/21 Referring Provider:     Pulmonary Rehab from 06/25/2016 in [facility] Cardiac and Pulmonary Rehab  Referring Provider  Ramonita Lab MD      Encounter Date: 11/06/2016  Check In:     Session Check In - 11/06/16 0955      Check-In   Location [facility]-Cardiac & Pulmonary Rehab   Staff Present Nada Maclachlan, BA, ACSM CEP, Exercise Physiologist;Krista Frederico Hamman, RN BSN;Aries Townley Flavia Shipper   Supervising physician immediately available to respond to emergencies [facility] immediately available ER MD   Physician(s) Dr. Mariea Clonts and Joni Fears   Medication changes reported     No   Fall or balance concerns reported    No   Warm-up and Cool-down Performed as group-led instruction   Resistance Training Performed Yes   VAD Patient? No     Pain Assessment   Currently in Pain? No/denies   Multiple Pain Sites No         History  Smoking Status  . Former Smoker  . Packs/day: 1.00  . Years: 33.00  . Types: Cigarettes  . Quit date: 10/03/1991  Smokeless Tobacco  . Never Used    Goals Met:  Independence with exercise equipment Exercise tolerated well No report of cardiac concerns or symptoms Strength training completed today  Goals Unmet:  Not Applicable  Comments: Pt able to follow exercise prescription today without complaint.  Will continue to monitor for progression.   Dr. Emily Filbert is Medical Director for [name] and [facility] Rehabilitation.

## 2016-11-08 DIAGNOSIS — J449 Chronic obstructive pulmonary disease, unspecified: Secondary | ICD-10-CM | POA: Diagnosis not present

## 2016-11-08 NOTE — Progress Notes (Signed)
Daily Session Note  Patient Details  Name: Bradley Barnes. MRN: [id number] Date of Birth: Sep 07, 1940 Referring Provider:     Pulmonary Rehab from 06/25/2016 in [facility] Cardiac and Pulmonary Rehab  Referring Provider  Ramonita Lab MD      Encounter Date: 11/08/2016  Check In:     Session Check In - 11/08/16 0958      Check-In   Location [facility]-Cardiac & Pulmonary Rehab   Staff Present Nada Maclachlan, BA, ACSM CEP, Exercise Physiologist;Alondra Vandeven Alcus Dad, RN BSN   Supervising physician immediately available to respond to emergencies [facility] immediately available ER MD   Physician(s) Dr. Kerman Passey and Quentin Cornwall   Medication changes reported     No   Fall or balance concerns reported    No   Warm-up and Cool-down Performed as group-led instruction   Resistance Training Performed Yes   VAD Patient? No     Pain Assessment   Currently in Pain? No/denies   Multiple Pain Sites No         History  Smoking Status  . Former Smoker  . Packs/day: 1.00  . Years: 33.00  . Types: Cigarettes  . Quit date: 10/03/1991  Smokeless Tobacco  . Never Used    Goals Met:  Independence with exercise equipment Exercise tolerated well No report of cardiac concerns or symptoms Strength training completed today  Goals Unmet:  Not Applicable  Comments: Pt able to follow exercise prescription today without complaint.  Will continue to monitor for progression.   Dr. Emily Filbert is Medical Director for [place] and [facility]monary Rehabilitation.

## 2016-11-11 DIAGNOSIS — J449 Chronic obstructive pulmonary disease, unspecified: Secondary | ICD-10-CM

## 2016-11-11 NOTE — Progress Notes (Signed)
Pulmonary Individual Treatment Plan  Patient Details  Name: Bradley Barnes. MRN: [id number] Date of Birth: 1940-05-29 Referring Provider:     Pulmonary Rehab from 06/25/2016 in [facility] Cardiac and Pulmonary Rehab  Referring Provider  Ramonita Lab MD      Initial Encounter Date:    Pulmonary Rehab from 06/25/2016 in [facility] Cardiac and Pulmonary Rehab  Date  06/25/16  Referring Provider  Ramonita Lab MD      Visit Diagnosis: Chronic obstructive pulmonary disease, unspecified COPD type ([place])  Patient's Home Medications on Admission:  Current Outpatient Prescriptions:  .  Acetaminophen 500 MG coapsule, Take by mouth., Disp: , Rfl:  .  albuterol (PROAIR HFA) 108 (90 Base) MCG/ACT inhaler, Inhale into the lungs., Disp: , Rfl:  .  Alum Hydroxide-Mag Carbonate (GAVISCON EXTRA RELIEF FORMULA PO), Take by mouth., Disp: , Rfl:  .  amLODipine (NORVASC) 10 MG tablet, Take by mouth., Disp: , Rfl:  .  aspirin EC 81 MG tablet, Take by mouth., Disp: , Rfl:  .  atorvastatin (LIPITOR) 40 MG tablet, Take by mouth., Disp: , Rfl:  .  furosemide (LASIX) 40 MG tablet, Take 40 mg by mouth., Disp: , Rfl:  .  Glycopyrrolate-Formoterol (BEVESPI AEROSPHERE) 9-4.8 MCG/ACT AERO, Inhale 2 puffs into the lungs 2 (two) times daily., Disp: 10.7 g, Rfl: 5 .  Investigational - Study Medication, Take by mouth. [facility] IDS, Disp: , Rfl:  .  isosorbide mononitrate (IMDUR) 60 MG 24 hr tablet, Take by mouth., Disp: , Rfl:  .  metoprolol (TOPROL-XL) 200 MG 24 hr tablet, Take by mouth., Disp: , Rfl:  .  mexiletine (MEXITIL) 200 MG capsule, Take 200 mg by mouth 3 (three) times daily., Disp: , Rfl:  .  Multiple Vitamin (MULTI-VITAMINS) TABS, Take by mouth., Disp: , Rfl:  .  nitroGLYCERIN (NITROSTAT) 0.4 MG SL tablet, Place under the tongue., Disp: , Rfl:  .  Omega-3 1000 MG CAPS, Take by mouth., Disp: , Rfl:  .  ramipril (ALTACE) 10 MG capsule, Take by mouth., Disp: , Rfl:  .  ranitidine (ZANTAC) 300 MG tablet, Take by  mouth., Disp: , Rfl:  .  sodium chloride (OCEAN) 0.65 % nasal spray, Place 1 spray into the nose as needed for congestion., Disp: , Rfl:  .  sodium chloride (V-R NASAL SPRAY SALINE) 0.65 % nasal spray, , Disp: , Rfl:  .  tamsulosin (FLOMAX) 0.4 MG CAPS capsule, Take 0.4 mg by mouth., Disp: , Rfl:  .  tiotropium (SPIRIVA) 18 MCG inhalation capsule, Place into inhaler and inhale., Disp: , Rfl:  .  Tiotropium Bromide Monohydrate (SPIRIVA RESPIMAT) 2.5 MCG/ACT AERS, Inhale 2 puffs into the lungs daily., Disp: 1 Inhaler, Rfl: 0 .  UNABLE TO FIND, Place 1 drop into both eyes daily. Med Name: Dextran 70, Disp: , Rfl:   Past Medical History: Past Medical History:  Diagnosis Date  . Chronic kidney disease   . COPD (chronic obstructive pulmonary disease) ([place])   . Hyperlipidemia   . Hypertension   . Myocardial infarction   . Sleep apnea     Tobacco Use: History  Smoking Status  . Former Smoker  . Packs/day: 1.00  . Years: 33.00  . Types: Cigarettes  . Quit date: 10/03/1991  Smokeless Tobacco  . Never Used    Labs: Recent Review Flowsheet Data    There is no flowsheet data to display.       Pulmonary Assessment Scores:     Pulmonary Assessment Scores  [place] Name 06/25/16 1519 08/12/16 1354 11/06/16 1018     ADL UCSD   ADL Phase Entry Mid Exit   SOB Score total 52 47 46   Rest 0 1 0   Walk _0 Stairs _1 Bath _2 Dress 2 0 1   Shop _3 mMRC Score   mMRC Score 2  - 2      Pulmonary Function Assessment:     Pulmonary Function Assessment - 06/25/16 1517      Pulmonary Function Tests   RV% 84 %   DLCO% 50 %     Initial Spirometry Results   FVC% 62 %   FEV1% 55 %   FEV1/FVC Ratio 64   Comments Test date 09/20/15     Post Bronchodilator Spirometry Results   FVC% 60 %   FEV1% 57 %   FEV1/FVC Ratio 68     Breath   Bilateral Breath Sounds Clear   Shortness of Breath Yes;Fear of Shortness of Breath;Limiting activity      Exercise Target  Goals:    Exercise Program Goal: Individual exercise prescription set with THRR, safety & activity barriers. Participant demonstrates ability to understand and report RPE using BORG scale, to self-measure pulse accurately, and to acknowledge the importance of the exercise prescription.  Exercise Prescription Goal: Starting with aerobic activity 30 plus minutes a day, 3 days per week for initial exercise prescription. Provide home exercise prescription and guidelines that participant acknowledges understanding prior to discharge.  Activity Barriers & Risk Stratification:     Activity Barriers & Cardiac Risk Stratification - 06/25/16 1426      Activity Barriers & Cardiac Risk Stratification   Activity Barriers Arthritis;Chest Pain/Angina;Joint Problems;Deconditioning;Muscular Weakness;Right Hip Replacement;Left Knee Replacement;Back Problems;Assistive Device;Shortness of Breath;Decreased Ventricular Function;Balance Concerns  uses rolling walker for support and balance   Cardiac Risk Stratification High      6 Minute Walk:     6 Minute Walk    Row Name 06/25/16 1421 11/06/16 1040       6 Minute Walk   Phase Initial Discharge    Distance 730 feet 925 feet    Distance % Change  - 27 %    Distance Feet Change  - 195 ft    Walk Time 6 minutes 6 minutes    # of Rest Breaks 0 0    MPH 1.38 1.75    METS 0 1.36    RPE 16 14    Perceived Dyspnea  3 3    VO2 Peak 3.34 4.76    Symptoms Yes (comment) Yes (comment)    Comments short of breath, using rolling walker SHORT OF BREATH, USING WALKER    Resting HR 57 bpm 57 bpm    Resting BP 134/64  -    Resting Oxygen Saturation   - 98 %    Exercise Oxygen Saturation  during 6 min walk  - 97 %    Max Ex. HR 72 bpm 71 bpm    Max Ex. BP 144/74 146/72    2 Minute Post BP 126/70  -      Interval HR   Baseline HR (retired) 57  -    1 Minute HR  - 63    2 Minute HR  - 68    3 Minute HR  - 70    4 Minute HR 72  -    5  Minute HR 72 71     6 Minute HR 72 70    2 Minute Post HR 56  -    Interval Heart Rate? Yes Yes      Interval Oxygen   Interval Oxygen? Yes  -    Baseline Oxygen Saturation % 98 % 98 %    Resting Liters of Oxygen 0 L  Room Air  -    1 Minute Oxygen Saturation %  - 99 %    1 Minute Liters of Oxygen 0 L 0 L    2 Minute Oxygen Saturation %  - 98 %    2 Minute Liters of Oxygen 0 L 0 L    3 Minute Oxygen Saturation % 90 % 97 %    3 Minute Liters of Oxygen 0 L 0 L    4 Minute Oxygen Saturation % 95 %  -    4 Minute Liters of Oxygen 0 L 0 L    5 Minute Oxygen Saturation % 99 % 98 %    5 Minute Liters of Oxygen 0 L 0 L    6 Minute Oxygen Saturation % 99 % 98 %    6 Minute Liters of Oxygen 0 L 0 L    2 Minute Post Oxygen Saturation % 100 % 98 %    2 Minute Post Liters of Oxygen 0 L 0 L      Oxygen Initial Assessment:     Oxygen Initial Assessment - 06/25/16 1522      Home Oxygen   Home Oxygen Device None      Oxygen Re-Evaluation:     Oxygen Re-Evaluation    Row Name 07/15/16 1419 08/05/16 1522 08/29/16 1243 10/30/16 1042       Program Oxygen Prescription   Program Oxygen Prescription None None None None      Home Oxygen   Home Oxygen Device None None None None    Sleep Oxygen Prescription  - None None CPAP  wears Nasal Mask    Liters per minute  -  -  - 0    Home Exercise Oxygen Prescription  - None None None    Home at Rest Exercise Oxygen Prescription  - None None None      Goals/Expected Outcomes   Short Term Goals  - To learn and understand importance of monitoring SPO2 with pulse oximeter and demonstrate accurate use of the pulse oximeter.;To Learn and understand importance of maintaining oxygen saturations>88%;To learn and demonstrate proper purse lipped breathing techniques or other breathing techniques.;To learn and demonstrate proper use of respiratory medications To learn and understand importance of monitoring SPO2 with pulse oximeter and demonstrate accurate use of the pulse  oximeter.;To Learn and understand importance of maintaining oxygen saturations>88%;To learn and demonstrate proper purse lipped breathing techniques or other breathing techniques.;To learn and demonstrate proper use of respiratory medications To learn and understand importance of maintaining oxygen saturations>88%;To learn and understand importance of monitoring SPO2 with pulse oximeter and demonstrate accurate use of the pulse oximeter.;To learn and demonstrate proper pursed lip breathing techniques or other breathing techniques.;To learn and demonstrate proper use of respiratory medications    Long  Term Goals  - Exhibits compliance with exercise, home and travel O2 prescription;Maintenance of O2 saturations>88%;Compliance with respiratory medication;Verbalizes importance of monitoring SPO2 with pulse oximeter and return demonstration;Exhibits proper breathing techniques, such as purse lipped breathing or other method taught during program session;Demonstrates proper use of MDI's Compliance with respiratory medication;Maintenance of O2  saturations>88%;Exhibits proper breathing techniques, such as purse lipped breathing or other method taught during program session;Demonstrates proper use of MDI's;Verbalizes importance of monitoring SPO2 with pulse oximeter and return demonstration Maintenance of O2 saturations>88%;Compliance with respiratory medication;Demonstrates proper use of MDI's;Exhibits proper breathing techniques, such as pursed lip breathing or other method taught during program session;Verbalizes importance of monitoring SPO2 with pulse oximeter and return demonstration    Comments  - Patient has been doing well with his O2 saturations since he has been in the program. Patient feels like he needs to work on losing weight. Bradley Barnes's O2 saturation has not dropped below 90% while in the program. He is checking his oxygen at home with his pulse ox regularly. He is taking his repiratory medications with  a spacer. Bradley Barnes states he still wears his CPAP everytime he sleeps. Bradley Barnes states he is using his CPAP everynight. He does not wear oxygen at home. He has his own pulse oximeter and checks it in the morning and when he is walking. He verbalizes that his oxygen level needs to be 88 percent and above. He has been better at using PLB. Bradley Barnes said he ised PLB when he cut down a small tree the other day. Bradley Barnes takes his respiratory medications as prescribed and uses them properly.    Goals/Expected Outcomes  - Short term: Understand when to check and the importance of monitoring O2 saturation. Long Term: use proper breathing techniques and monitor his own O2 saturation.  Short: continue to work on proper breathing techniques. Long. Be proficient in PLB techniques. Short: Check oxygen at home to maintain oxygen above 88 percent. Long: maintain checking oxygen at home regularly       Oxygen Discharge (Final Oxygen Re-Evaluation):     Oxygen Re-Evaluation - 10/30/16 1042      Program Oxygen Prescription   Program Oxygen Prescription None     Home Oxygen   Home Oxygen Device None   Sleep Oxygen Prescription CPAP  wears Nasal Mask   Liters per minute 0   Home Exercise Oxygen Prescription None   Home at Rest Exercise Oxygen Prescription None     Goals/Expected Outcomes   Short Term Goals To learn and understand importance of maintaining oxygen saturations>88%;To learn and understand importance of monitoring SPO2 with pulse oximeter and demonstrate accurate use of the pulse oximeter.;To learn and demonstrate proper pursed lip breathing techniques or other breathing techniques.;To learn and demonstrate proper use of respiratory medications   Long  Term Goals Maintenance of O2 saturations>88%;Compliance with respiratory medication;Demonstrates proper use of MDI's;Exhibits proper breathing techniques, such as pursed lip breathing or other method taught during program session;Verbalizes importance of monitoring  SPO2 with pulse oximeter and return demonstration   Comments Bradley Barnes states he is using his CPAP everynight. He does not wear oxygen at home. He has his own pulse oximeter and checks it in the morning and when he is walking. He verbalizes that his oxygen level needs to be 88 percent and above. He has been better at using PLB. Bradley Barnes said he ised PLB when he cut down a small tree the other day. Bradley Barnes takes his respiratory medications as prescribed and uses them properly.   Goals/Expected Outcomes Short: Check oxygen at home to maintain oxygen above 88 percent. Long: maintain checking oxygen at home regularly      Initial Exercise Prescription:     Initial Exercise Prescription - 06/25/16 1400      Date of Initial Exercise RX and Referring Provider  Date 06/25/16   Referring Provider Ramonita Lab MD     Treadmill   MPH 1.1   Grade 0   Minutes 15   METs 1.84     Bike   Level 0.5   Watts 10   Minutes 15   METs 1.5     T5 Nustep   Level 1   SPM 80   Minutes 15   METs 1.5     Track   Laps 15   Minutes 15   METs 1.7     Prescription Details   Frequency (times per week) 3   Duration Progress to 45 minutes of aerobic exercise without signs/symptoms of physical distress     Intensity   THRR 40-80% of Max Heartrate 91-127   Ratings of Perceived Exertion 11-13   Perceived Dyspnea 0-4     Progression   Progression Continue to progress workloads to maintain intensity without signs/symptoms of physical distress.     Resistance Training   Training Prescription Yes   Weight 3 lbs   Reps 10-15      Perform Capillary Blood Glucose checks as needed.  Exercise Prescription Changes:     Exercise Prescription Changes    Row Name 07/03/16 1200 07/23/16 1300 08/02/16 1300 08/07/16 1500 08/23/16 0900     Response to Exercise   Blood Pressure (Admit) 102/64 112/62  - 124/72 126/68   Blood Pressure (Exercise) 146/64 120/70  - 142/84 142/70   Blood Pressure (Exit) 104/58 102/60  -  132/76 132/74   Heart Rate (Admit) 56 bpm 57 bpm  - 61 bpm 54 bpm   Heart Rate (Exercise) 64 bpm 60 bpm  - 65 bpm 64 bpm   Heart Rate (Exit) 54 bpm 50 bpm  - 50 bpm 54 bpm   Oxygen Saturation (Admit) 100 % 98 %  - 98 % 98 %   Oxygen Saturation (Exercise) 99 % 198 %  - 97 % 97 %   Oxygen Saturation (Exit) 100 % 98 %  - 99 % 98 %   Rating of Perceived Exertion (Exercise) 14 14  - 14 14   Perceived Dyspnea (Exercise) 2 2.5  - 2 2   Duration Progress to 45 minutes of aerobic exercise without signs/symptoms of physical distress Progress to 45 minutes of aerobic exercise without signs/symptoms of physical distress  - Continue with 45 min of aerobic exercise without signs/symptoms of physical distress. Continue with 45 min of aerobic exercise without signs/symptoms of physical distress.   Intensity THRR unchanged THRR unchanged  - THRR unchanged Other (comment)  PM and meds keep Bradley Barnes from attaining THR     Progression   Progression Continue to progress workloads to maintain intensity without signs/symptoms of physical distress. Continue to progress workloads to maintain intensity without signs/symptoms of physical distress.  - Continue to progress workloads to maintain intensity without signs/symptoms of physical distress. Continue to progress workloads to maintain intensity without signs/symptoms of physical distress.   Average METs  - 2.38  - 3.28 1.8     Resistance Training   Training Prescription Yes Yes  - Yes Yes   Weight 4 3  - 3 4   Reps 10-15 10-15  - 10-15 10-15   Time  -  -  - 2.29 Minutes  -     Interval Training   Interval Training  - No  - No No     Oxygen   Liters  - 0  -  -  -  Bike   Level 0.5  -  -  -  -   Watts 10 31  - 50 50   Minutes 15 15  - 15 15   METs  - 2.76  - 3.28 3.28     T5 Nustep   Level 1 2  -  - 3   SPM 103 95  -  - 74   Minutes 15 15  -  - 15   METs 2.1 2  -  - 1.9     Track   Laps  -  -  - 20 17   Minutes  -  -  - 15 15   METs  -  -  - 1.92  1.77     Home Exercise Plan   Plans to continue exercise at  -  - [employer] (comment)  walk at [place]  -  -   Frequency  -  - Add 2 additional days to program exercise sessions.  -  -   Initial Home Exercises Provided  -  - 08/02/16  -  -   [place] Name 09/04/16 1200 09/18/16 1200 10/02/16 1200 10/16/16 1400 10/30/16 1300     Response to Exercise   Blood Pressure (Admit) 125/56 134/64 122/78 140/68 130/20   Blood Pressure (Exit) 138/62 124/74 126/68 128/70 128/66   Heart Rate (Admit) 54 bpm 59 bpm 55 bpm 59 bpm 54 bpm   Heart Rate (Exercise) 61 bpm 64 bpm 67 bpm 65 bpm 61 bpm   Heart Rate (Exit) 58 bpm 54 bpm 98 bpm 98 bpm 54 bpm   Oxygen Saturation (Admit) 97 % 97 % 97 % 95 % 98 %   Oxygen Saturation (Exercise) 98 % 98 % 98 % 96 % 99 %   Oxygen Saturation (Exit) 97 % 98 % 98 % 98 % 99 %   Rating of Perceived Exertion (Exercise) _0 Perceived Dyspnea (Exercise) _1 Duration Continue with 45 min of aerobic exercise without signs/symptoms of physical distress. Continue with 45 min of aerobic exercise without signs/symptoms of physical distress. Continue with 45 min of aerobic exercise without signs/symptoms of physical distress. Continue with 45 min of aerobic exercise without signs/symptoms of physical distress. Continue with 45 min of aerobic exercise without signs/symptoms of physical distress.   Intensity Other (comment)  pacemaker Other (comment) Other (comment)  PM Other (comment) Other (comment)  PM keeps HR low     Progression   Progression Continue to progress workloads to maintain intensity without signs/symptoms of physical distress. Continue to progress workloads to maintain intensity without signs/symptoms of physical distress. Continue to progress workloads to maintain intensity without signs/symptoms of physical distress. Continue to progress workloads to maintain intensity without signs/symptoms of physical distress. Continue to progress  workloads to maintain intensity without signs/symptoms of physical distress.   Average METs 2.75 2.5  - 2.53 2.4     Resistance Training   Training Prescription Yes Yes  - Yes Yes   Weight 4 4  - 4 4 lb   Reps 10-15 10-15  - 10-15 10-15     Interval Training   Interval Training No No  -  - No     Bike   Watts 58 63 50 63 58   Minutes _2 METs 3.57 3.6 3.28 3.64 3.5     T5 Nustep   Level  -  3  - 3 3   SPM  - 60  - 61 59   Minutes  - 15  - 15 15   METs  - 2  - 2.1 2     Track   Laps _0 Minutes _1 METs 1.92 2.15 2 1.84 1.92      Exercise Comments:     Exercise Comments    Row Name 07/03/16 1247           Exercise Comments First full day of exercise!  Patient was oriented to gym and equipment including functions, settings, policies, and procedures.  Patient's individual exercise prescription and treatment plan were reviewed.  All starting workloads were established based on the results of the 6 minute walk test done at initial orientation visit.  The plan for exercise progression was also introduced and progression will be customized based on patient's performance and goals.          Exercise Goals and Review:     Exercise Goals    Row Name 06/25/16 1428             Exercise Goals   Increase Physical Activity Yes       Intervention Provide advice, education, support and counseling about physical activity/exercise needs.;Develop an individualized exercise prescription for aerobic and resistive training based on initial evaluation findings, risk stratification, comorbidities and participant's personal goals.       Expected Outcomes Achievement of increased cardiorespiratory fitness and enhanced flexibility, muscular endurance and strength shown through measurements of functional capacity and personal statement of participant.       Increase Strength and Stamina Yes       Intervention Provide advice, education, support and  counseling about physical activity/exercise needs.;Develop an individualized exercise prescription for aerobic and resistive training based on initial evaluation findings, risk stratification, comorbidities and participant's personal goals.       Expected Outcomes Achievement of increased cardiorespiratory fitness and enhanced flexibility, muscular endurance and strength shown through measurements of functional capacity and personal statement of participant.          Exercise Goals Re-Evaluation :     Exercise Goals Re-Evaluation    [place] Name 07/23/16 1319 08/02/16 1346 08/07/16 1517 08/23/16 0936 09/04/16 1237     Exercise Goal Re-Evaluation   Exercise Goals Review Increase Physical Activity;Increase Strenth and Stamina Increase Physical Activity;Increase Strenth and Stamina Increase Physical Activity;Increase Strenth and Stamina Increase Physical Activity;Increase Strenth and Stamina Increase Physical Activity;Increase Strenth and Stamina   Comments Bradley Barnes is tolerating exercise well so far.    Reviewed home exercise with pt today.  Pt plans to walk  for exercise.  Reviewed THR, pulse, RPE, sign and symptoms, NTG use, and when to call 911 or MD.  Also discussed weather considerations and indoor options.  Pt voiced understanding. Bradley Barnes is increasing his MET level overall.  He is walking at the [facility] on days he doesnt attend  LW. Bradley Barnes's pacemaker and medications keep him from reaching his target HR.  His RPE indicates he is working at a proper level. Bradley Barnes has inceased his overall MET level slowly.  He is also walking on days he doesnt attend LW.   Expected Outcomes Short- Bradley Barnes will increase his stamina for ADLs.  Long - Bradley Barnes will continue to exercise regularly outside of LW. Short - pt will continue to exercise on days not at [place] - Pt will cotinue  to exercise after completing the program. Short - Bradley Barnes will continue regular attendance at [facility].  Long - Bradley Barnes will graduate and exercise on  his own. Short - Bradley Barnes will continue to exercise in LW.  Long - Bradley Barnes will complete LW and exercise on his own. Short - Bradley Barnes will continue to exercise outside LW class.  Long - Bradley Barnes will continue to exercise on his own after completing LW.   [place] Name 09/18/16 1250 10/02/16 1243 10/16/16 1448 10/30/16 1407       Exercise Goal Re-Evaluation   Exercise Goals Review Increase Physical Activity;Increase Strength and Stamina;Able to understand and use rate of perceived exertion (RPE) scale;Able to understand and use Dyspnea scale Increase Physical Activity;Increase Strength and Stamina Increase Strength and Stamina Increase Physical Activity;Increase Strength and Stamina;Able to understand and use rate of perceived exertion (RPE) scale;Knowledge and understanding of Target Heart Rate Range (THRR);Able to understand and use Dyspnea scale    Comments Bradley Barnes has been able to walk more laps at the [employer] center.  He is progressing well with exercise. Bradley Barnes is stil walking at [facility] when possible outside of class. Bradley Barnes continues to tolerate exercise well.   Bradley Barnes is maintaining his MET level.  He aso walks on days he does not attend LW.    Expected Outcomes Short - Bradley Barnes will continue to increase his walking.  Long - Bradley Barnes will continue exercise on his own. Short - Bradley Barnes will continue to exercise in class and on his own.  Long - Bradley Barnes will maintain exercise on his own. Short - Bradley Barnes will continue to attend regularly.  Long - Bradley Barnes will finish LW and exercise on his own. Short - Bradley Barnes will finish LW.  Long - Bradley Barnes will exercise on his own.       Discharge Exercise Prescription (Final Exercise Prescription Changes):     Exercise Prescription Changes - 10/30/16 1300      Response to Exercise   Blood Pressure (Admit) 130/20   Blood Pressure (Exit) 128/66   Heart Rate (Admit) 54 bpm   Heart Rate (Exercise) 61 bpm   Heart Rate (Exit) 54 bpm   Oxygen Saturation (Admit) 98 %   Oxygen Saturation (Exercise) 99 %    Oxygen Saturation (Exit) 99 %   Rating of Perceived Exertion (Exercise) 14   Perceived Dyspnea (Exercise) 3   Duration Continue with 45 min of aerobic exercise without signs/symptoms of physical distress.   Intensity Other (comment)  PM keeps HR low     Progression   Progression Continue to progress workloads to maintain intensity without signs/symptoms of physical distress.   Average METs 2.4     Resistance Training   Training Prescription Yes   Weight 4 lb   Reps 10-15     Interval Training   Interval Training No     Bike   Watts 58   Minutes 15   METs 3.5     T5 Nustep   Level 3   SPM 59   Minutes 15   METs 2     Track   Laps 23   Minutes 15   METs 1.92      Nutrition:  Target Goals: Understanding of nutrition guidelines, daily intake of sodium <1545m, cholesterol <2033m calories 30% from fat and 7% or less from saturated fats, daily to have 5 or more servings of fruits and vegetables.  Biometrics:     Pre Biometrics - 06/25/16 1428      Pre  Biometrics   Height 6' 2" (1.88 m)   Weight 271 lb 3.2 oz (123 kg)   Waist Circumference 52 inches   Hip Circumference 44 inches   Waist to Hip Ratio 1.18 %   BMI (Calculated) 34.9       Nutrition Therapy Plan and Nutrition Goals:     Nutrition Therapy & Goals - 08/05/16 1527      Nutrition Therapy   RD appointment defered Yes     Personal Nutrition Goals   Comments patient states his Wife is a nutritionist     Intervention Plan   Intervention Nutrition handout(s) given to patient.   Expected Outcomes Long Term Goal: Adherence to prescribed nutrition plan.;Short Term Goal: Understand basic principles of dietary content, such as calories, fat, sodium, cholesterol and nutrients.      Nutrition Discharge: Rate Your Plate Scores:     Nutrition Assessments - 11/06/16 1020      MEDFICTS Scores   Pre Score 3      Nutrition Goals Re-Evaluation:     Nutrition Goals Re-Evaluation    Row Name  08/29/16 1250 11/01/16 1149           Goals   Current Weight 267 lb (121.1 kg) 260 lb (117.9 kg)      Nutrition Goal  - To eat less portions and lose weight.      Comment When asked to meet with the Dietician he states his wife was a nutritionist and she helps him.  Bradley Barnes declines to meet with the dietician. He states he has been eating smaller portions. He does not use salt and has been limiting his red meats. He has lost some weight and is determined to keep going.      Expected Outcome Short: lose weight by dieting better at home with the help of his wife. Long: Maintain a healthy diet for weight loss Short: decrease portion size of meals. Long: Maintain a healthy eating lifestyle.         Nutrition Goals Discharge (Final Nutrition Goals Re-Evaluation):     Nutrition Goals Re-Evaluation - 11/01/16 1149      Goals   Current Weight 260 lb (117.9 kg)   Nutrition Goal To eat less portions and lose weight.   Comment Bradley Barnes declines to meet with the dietician. He states he has been eating smaller portions. He does not use salt and has been limiting his red meats. He has lost some weight and is determined to keep going.   Expected Outcome Short: decrease portion size of meals. Long: Maintain a healthy eating lifestyle.      Psychosocial: Target Goals: Acknowledge presence or absence of significant depression and/or stress, maximize coping skills, provide positive support system. Participant is able to verbalize types and ability to use techniques and skills needed for reducing stress and depression.   Initial Review & Psychosocial Screening:     Initial Psych Review & Screening - 06/25/16 [place]? Yes   Comments Bradley Barnes has good support from his wife and children. 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 and ICU hospitalization in June. Bradley Barnes is looking forward to supervised exercise and learning more about his COPD.      Barriers   Psychosocial barriers to participate in program There are no identifiable barriers or psychosocial needs.;The patient should benefit from training in stress management and relaxation.     Screening  Interventions   Interventions Encouraged to exercise      Quality of Life Scores:     Quality of Life - 11/06/16 1024      Quality of Life Scores   Health/Function Pre 20.06 %   Health/Function Post 18.56 %   Health/Function % Change -7.48 %   Socioeconomic Pre 19.83 %   Socioeconomic Post 24.21 %   Socioeconomic % Change  22.09 %   Psych/Spiritual Pre 20.33 %   Psych/Spiritual Post 23.71 %   Psych/Spiritual % Change 16.63 %   Family Pre 21 %   Family Post 25.2 %   Family % Change 20 %   GLOBAL Pre 20.19 %   GLOBAL Post 21.67 %   GLOBAL % Change 7.33 %      PHQ-9: Recent Review Flowsheet Data    Depression screen [facility] 2/9 11/06/2016 06/25/2016 09/26/2015 08/07/2015   Decreased Interest 0 _0 Down, Depressed, Hopeless 0 0 0 0   PHQ - 2 Score 0 _1 Altered sleeping _2 Tired, decreased energy _3 Change in appetite 0 1 2 0   Feeling bad or failure about yourself  0 0 0 0   Trouble concentrating _4 Moving slowly or fidgety/restless 0 0 1 0   Suicidal thoughts 0 0 0 0   PHQ-9 Score _5 Difficult doing work/chores Somewhat difficult Very difficult Somewhat difficult Somewhat difficult     Interpretation of Total Score  Total Score Depression Severity:  1-4 = Minimal depression, 5-9 = Mild depression, 10-14 = Moderate depression, 15-19 = Moderately severe depression, 20-27 = Severe depression   Psychosocial Evaluation and Intervention:     Psychosocial Evaluation - 07/22/16 1113      Psychosocial Evaluation & Interventions   Comments Bradley Barnes has returned this year and now qualifies for the Pulmonary Rehab program.  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  He "coded" twice in Mar 09, 2022 according to his report and his Dr has  encouraged him to come for rehabilitation.  Bradley Barnes [facility]) has a strong support system with a spouse of 24 years, (that he calls his "gift") and a daughter who lives locally.  He is also actively involved in his local church and a men's prayer group.  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  He uses a CPAP to sleep, which is helpful and he, and states his appetite is "too good!"  Bradley Barnes denies a history of depression or anxiety or current symptoms.  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  But he saw a psychiatrist and a pastor to help him through this.  Bradley Barnes's mood is generally positive and he reports other than his health, he has minimal stress in his life.  He has goals to lose wieght; increase his stamina and strength, and improve his shortness of breath.  Staff will be following Bradley Barnes throughout the course of this program.     Expected Outcomes Bradley Barnes will benefit from consistent exercise to achieve his stated goals.  He also should meet with the dietician to address his weight loss goal.  With the stress in Bradley Barnes's life due  to multiple health issues, attending the psychoeducational components of this program will be helpful as well to refresh his coping strategies.     Continue Psychosocial Services  Follow up required by staff      Psychosocial Re-Evaluation:     Psychosocial Re-Evaluation    [place] Name 07/15/16 1428 08/05/16 1530 08/19/16 1117 08/28/16 1128 08/29/16 1253     Psychosocial Re-Evaluation   Current issues with  - None Identified  -  - None Identified   Comments Bradley Barnes has attended [facility] 3 sessions. He is still nervous about not wearing a heart monitor due to his past heart codes. He does have a strong [place] faith and has had several people praying for him which has helped  the most with his anxiety of another cardiac episode.  He also worries that he is a burden to his wife, since Bradley Barnes can not drive and she has her own health conditions.  Bradley. Barnes seems reserved about his feelings but has been talking about his activities of his daily life. Its healthy for Bradley. Labell to talk about his daily life to develope better communication between staff and himself. Counselor follow with Bradley Barnes today reporting he is noticing some improvement with his stamina as he walks on his "off days" here and states he can go further and longer than before coming into this program.  He continues to sleep okay and his mood is stable - although he went to a funeral of a 5 year old friend this past weekend and was sad about this today.  Bradley Barnes states his stress currently is his spouse's new dog who is so big and is "dragging her" when she walks him - which concerns him for his spouse's safety.  Discussed ways to cope with this and his recent loss.  Counselor commended Engineer, technical sales on his progress made and commitment to consistent exercise.  Counselor follow up with Bradley Barnes today reporting the education portion of this program has been tremendously helpful in learning how to breathe better; what is COPD; and how to manage his disease in a more positive way.  Counselor commended Engineer, technical sales on his participation in every aspect of this program.    -   Expected Outcomes Short: continue exercising and learning from the health lectures. Long term: in 3 months, Bradley Epperly will be driving and will share responsibilities with home activities. Short Term: Continue to exercise and have a positive outlook. Long Term: Bradley. Harty would like to do more things at home such as read, play solitare, and chores. Bradley Barnes will benefit from consistent exercise and reminders of positive ways to cope with stress in his life currently.  - Continue to exercise and stay positive with any stress he may have   Interventions Encouraged to  attend Pulmonary Rehabilitation for the exercise;Relaxation education;Stress management education Encouraged to attend Pulmonary Rehabilitation for the exercise Relaxation education;Stress management education  - Encouraged to attend Pulmonary Rehabilitation for the exercise   Continue Psychosocial Services  Follow up required by counselor Follow up required by staff Follow up required by staff  - Follow up required by staff   [place] Name 09/25/16 1116 11/06/16 1455           Psychosocial Re-Evaluation   Current issues with  - None Identified      Comments Counselor follow up today with Bradley Barnes reporting he has made some progress with some weight loss and having more stamina since coming into this program.  He continues to  sleep well and is working on increased balance with a Physical Therapist who gave him some exercises to do.  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  Counselor processeed with Bradley Barnes some coping strategies he is using and encouraged use of mindfulness techniques to stay in the moments and not in the past or future.  He is in a positive mood and counselor commended him for the progress he has made since coming into this program.   Bradley Barnes has been positive throughout the program and shows more enthusiasm.       Expected Outcomes Bradley Barnes will continue to work on his balance and exercise consistently.  He will also use stress management techniques discussed to cope better with the recent stressors he is experiencing.   Short: keep a positive attitude. Long: Maintain a positive attitude post [facility].      Interventions Stress management education Encouraged to attend Pulmonary Rehabilitation for the exercise      Continue Psychosocial Services   - Follow up required by staff         Psychosocial Discharge (Final Psychosocial Re-Evaluation):     Psychosocial Re-Evaluation - 11/06/16 1455      Psychosocial Re-Evaluation    Current issues with None Identified   Comments Bradley Barnes has been positive throughout the program and shows more enthusiasm.    Expected Outcomes Short: keep a positive attitude. Long: Maintain a positive attitude post [facility].   Interventions Encouraged to attend Pulmonary Rehabilitation for the exercise   Continue Psychosocial Services  Follow up required by staff      Education: Education Goals: Education classes will be provided on a weekly basis, covering required topics. Participant will state understanding/return demonstration of topics presented.  Learning Barriers/Preferences:     Learning Barriers/Preferences - 06/25/16 1517      Learning Barriers/Preferences   Learning Barriers Exercise Concerns   Learning Preferences None      Education Topics: Initial Evaluation Education: - Verbal, written and demonstration of respiratory meds, RPE/PD scales, oximetry and breathing techniques. Instruction on use of nebulizers and MDIs: cleaning and proper use, rinsing mouth with steroid doses and importance of monitoring MDI activations.   Pulmonary Rehab from 11/06/2016 in [facility] Cardiac and Pulmonary Rehab  Date  06/25/16  Educator  LB  Instruction Review Code (retired)  2- meets goals/outcomes      General Nutrition Guidelines/Fats and Fiber: -Group instruction provided by verbal, written material, models and posters to present the general guidelines for heart healthy nutrition. Gives an explanation and review of dietary fats and fiber.   Cardiac Rehab from 10/31/2015 in [facility] Cardiac and Pulmonary Rehab  Date  10/10/15  Educator  CR  Instruction Review Code (retired)  R- Review/reinforce [Second Class]      Controlling Sodium/Reading Food Labels: -Group verbal and written material supporting the discussion of sodium use in heart healthy nutrition. Review and explanation with models, verbal and written materials for utilization of the food label.   Cardiac Rehab from 10/31/2015  in [facility] Cardiac and Pulmonary Rehab  Date  08/22/15  Educator  Erlene Quan  Instruction Review Code (retired)  2- meets goals/outcomes      Exercise Physiology & Risk Factors: - Group verbal and written instruction with models to review the exercise physiology of the cardiovascular system and associated critical values. Details cardiovascular disease risk factors and the goals associated with each risk factor.   Cardiac Rehab from 10/31/2015 in  [facility] Cardiac and Pulmonary Rehab  Date  10/24/15  Educator  [facility]  Instruction Review Code (retired)  2- meets goals/outcomes      Aerobic Exercise & Resistance Training: - Gives group verbal and written discussion on the health impact of inactivity. On the components of aerobic and resistive training programs and the benefits of this training and how to safely progress through these programs.   Cardiac Rehab from 10/31/2015 in [facility] Cardiac and Pulmonary Rehab  Date  10/26/15  Educator  [facility]  Instruction Review Code (retired)  2- Statistician, Balance, General Exercise Guidelines: - Provides group verbal and written instruction on the benefits of flexibility and balance training programs. Provides general exercise guidelines with specific guidelines to those with heart or lung disease. Demonstration and skill practice provided.   Pulmonary Rehab from 11/06/2016 in [facility] Cardiac and Pulmonary Rehab  Date  09/04/16  Educator  AS  Instruction Review Code  1- Verbalizes Understanding      Stress Management: - Provides group verbal and written instruction about the health risks of elevated stress, cause of high stress, and healthy ways to reduce stress.   Cardiac Rehab from 10/31/2015 in [facility] Cardiac and Pulmonary Rehab  Date  09/07/15  Educator  CE  Instruction Review Code (retired)  2- meets goals/outcomes      Depression: - Provides group verbal and written instruction on the correlation between heart/lung disease  and depressed mood, treatment options, and the stigmas associated with seeking treatment.   Pulmonary Rehab from 11/06/2016 in [facility] Cardiac and Pulmonary Rehab  Instruction Review Code (retired)  Not applicable [Patient states he already had]  Instruction Review Code  5- Refused Teaching      Exercise & Equipment Safety: - Individual verbal instruction and demonstration of equipment use and safety with use of the equipment.   Infection Prevention: - Provides verbal and written material to individual with discussion of infection control including proper hand washing and proper equipment cleaning during exercise session.   Falls Prevention: - Provides verbal and written material to individual with discussion of falls prevention and safety.   Diabetes: - Individual verbal and written instruction to review signs/symptoms of diabetes, desired ranges of glucose level fasting, after meals and with exercise. Advice that pre and post exercise glucose checks will be done for 3 sessions at entry of program.   Chronic Lung Diseases: - Group verbal and written instruction to review new updates, new respiratory medications, new advancements in procedures and treatments. Provide informative websites and "800" numbers of self-education.   Pulmonary Rehab from 11/06/2016 in [facility] Cardiac and Pulmonary Rehab  Date  08/07/16  Educator  [facility]  Instruction Review Code  1- Verbalizes Understanding      Lung Procedures: - Group verbal and written instruction to describe testing methods done to diagnose lung disease. Review the outcome of test results. Describe the treatment choices: Pulmonary Function Tests, ABGs and oximetry.   Energy Conservation: - Provide group verbal and written instruction for methods to conserve energy, plan and organize activities. Instruct on pacing techniques, use of adaptive equipment and posture/positioning to relieve shortness of breath.   Pulmonary Rehab from 11/06/2016 in  [facility] Cardiac and Pulmonary Rehab  Date  07/03/16  Educator  [facility]  Instruction Review Code  1- Verbalizes Understanding      Triggers: - Group verbal and written instruction to review types of environmental controls: home humidity, furnaces, filters, dust mite/pet prevention, HEPA vacuums. To  discuss weather changes, air quality and the benefits of nasal washing.   Exacerbations: - Group verbal and written instruction to provide: warning signs, infection symptoms, calling MD promptly, preventive modes, and value of vaccinations. Review: effective airway clearance, coughing and/or vibration techniques. Create an Sports administrator.   Oxygen: - Individual and group verbal and written instruction on oxygen therapy. Includes supplement oxygen, available portable oxygen systems, continuous and intermittent flow rates, oxygen safety, concentrators, and Medicare reimbursement for oxygen.   Respiratory Medications: - Group verbal and written instruction to review medications for lung disease. Drug class, frequency, complications, importance of spacers, rinsing mouth after steroid MDI's, and proper cleaning methods for nebulizers.   Pulmonary Rehab from 11/06/2016 in [facility] Cardiac and Pulmonary Rehab  Date  06/25/16  Educator  LB  Instruction Review Code (retired)  2- meets goals/outcomes      AED/CPR: - Group verbal and written instruction with the use of models to demonstrate the basic use of the AED with the basic ABC's of resuscitation.   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 Applies individual practice skills.   Pulmonary Rehab from 11/06/2016 in [facility] Cardiac and Pulmonary Rehab  Date   06/25/16  Educator  LB  Instruction Review Code  2- Demonstrated Understanding      Anatomy and Physiology of the Lungs: - Group verbal and written instruction with the use of models to provide basic lung anatomy and physiology related to function, structure and complications of lung disease.   Pulmonary Rehab from 11/06/2016 in [facility] Cardiac and Pulmonary Rehab  Date  09/25/16  Educator  [facility]  Instruction Review Code  1- Verbalizes Understanding      Anatomy & Physiology of the Heart: - Group verbal and written instruction and models provide basic cardiac anatomy and physiology, with the coronary electrical and arterial systems. Review of: AMI, Angina, Valve disease, Heart Failure, Cardiac Arrhythmia, Pacemakers, and the ICD.   Cardiac Rehab from 10/31/2015 in [facility] Cardiac and Pulmonary Rehab  Date  09/12/15  Educator  SB  Instruction Review Code (retired)  2- meets goals/outcomes      Heart Failure: - Group verbal and written instruction on the basics of heart failure: signs/symptoms, treatments, explanation of ejection fraction, enlarged heart and cardiomyopathy.   Sleep Apnea: - Individual verbal and written instruction to review Obstructive Sleep Apnea. Review of risk factors, methods for diagnosing and types of masks and machines for OSA.   Pulmonary Rehab from 11/06/2016 in [facility] Cardiac and Pulmonary Rehab  Date  06/25/16  Educator  LB  Instruction Review Code  1- Verbalizes Understanding      Anxiety: - Provides group, verbal and written instruction on the correlation between heart/lung disease and anxiety, treatment options, and management of anxiety.   Relaxation: - Provides group, verbal and written instruction about the benefits of relaxation for patients with heart/lung disease. Also provides patients with examples of relaxation techniques.   Pulmonary Rehab from 11/06/2016 in [facility] Cardiac and Pulmonary Rehab  Date  11/06/16  Educator  [facility]  Instruction Review  Code  5- Refused Teaching      Cardiac Medications: - Group verbal and written instruction to review commonly prescribed medications for heart disease. Reviews  the medication, class of the drug, and side effects.   Cardiac Rehab from 10/31/2015 in [facility] Cardiac and Pulmonary Rehab  Date  09/21/15  Educator  CE  Instruction Review Code (retired)  2- meets goals/outcomes      Know Your Numbers: -Group verbal and written instruction about important numbers in your health.  Review of Cholesterol, Blood Pressure, Diabetes, and BMI and the role they play in your overall health.   Pulmonary Rehab from 11/06/2016 in [facility] Cardiac and Pulmonary Rehab  Date  11/01/16  Educator  [facility]  Instruction Review Code  5- Refused Teaching [states he already had and his wife keeps up with his numbers]      Other: -Provides group and verbal instruction on various topics (see comments)    Knowledge Questionnaire Score:     Knowledge Questionnaire Score - 11/06/16 1020      Knowledge Questionnaire Score   Pre Score 10/10   Post Score 10/10       Core Components/Risk Factors/Patient Goals at Admission:     Personal Goals and Risk Factors at Admission - 06/25/16 1522      Core Components/Risk Factors/Patient Goals on Admission    Weight Management Yes;Weight Loss   Intervention Weight Management: Develop a combined nutrition and exercise program designed to reach desired caloric intake, while maintaining appropriate intake of nutrient and fiber, sodium and fats, and appropriate energy expenditure required for the weight goal.;Weight Management: Provide education and appropriate resources to help participant work on and attain dietary goals.;Weight Management/Obesity: Establish reasonable short term and long term weight goals.;Obesity: Provide education and appropriate resources to help participant work on and attain dietary goals.   Admit Weight 271 lb 3.2 oz (123 kg)   Goal Weight: Short Term 266 lb  (120.7 kg)   Goal Weight: Long Term 220 lb (99.8 kg)   Expected Outcomes Long Term: Adherence to nutrition and physical activity/exercise program aimed toward attainment of established weight goal;Short Term: Continue to assess and modify interventions until short term weight is achieved;Weight Maintenance: Understanding of the daily nutrition guidelines, which includes 25-35% calories from fat, 7% or less cal from saturated fats, less than 23m cholesterol, less than 1.5gm of sodium, & 5 or more servings of fruits and vegetables daily;Weight Loss: Understanding of general recommendations for a balanced deficit meal plan, which promotes 1-2 lb weight loss per week and includes a negative energy balance of [phone] kcal/d;Understanding recommendations for meals to include 15-35% energy as protein, 25-35% energy from fat, 35-60% energy from carbohydrates, less than 2054mof dietary cholesterol, 20-35 gm of total fiber daily;Understanding of distribution of calorie intake throughout the day with the consumption of 4-5 meals/snacks   Improve shortness of breath with ADL's Yes   Intervention Provide education, individualized exercise plan and daily activity instruction to help decrease symptoms of SOB with activities of daily living.   Expected Outcomes Short Term: Achieves a reduction of symptoms when performing activities of daily living.   Develop more efficient breathing techniques such as purse lipped breathing and diaphragmatic breathing; and practicing self-pacing with activity Yes   Intervention Provide education, demonstration and support about specific breathing techniuqes utilized for more efficient breathing. Include techniques such as pursed lipped breathing, diaphragmatic breathing and self-pacing activity.   Expected Outcomes Short Term: Participant will be able to demonstrate and use breathing techniques as needed throughout daily activities.   Increase knowledge of respiratory medications and  ability to use respiratory devices properly  Yes  Anoro E  and Albuterol MDI; spacer given   Intervention Provide education and demonstration as needed of appropriate use of medications, inhalers, and oxygen therapy.   Expected Outcomes Short Term: Achieves understanding of medications use. Understands that oxygen is a medication prescribed by physician. Demonstrates appropriate use of inhaler and oxygen therapy.   Heart Failure Yes   Intervention Provide a combined exercise and nutrition program that is supplemented with education, support and counseling about heart failure. Directed toward relieving symptoms such as shortness of breath, decreased exercise tolerance, and extremity edema.   Expected Outcomes Improve functional capacity of life;Short term: Attendance in program 2-3 days a week with increased exercise capacity. Reported lower sodium intake. Reported increased fruit and vegetable intake. Reports medication compliance.;Short term: Daily weights obtained and reported for increase. Utilizing diuretic protocols set by physician.;Long term: Adoption of self-care skills and reduction of barriers for early signs and symptoms recognition and intervention leading to self-care maintenance.   Hypertension Yes   Intervention Provide education on lifestyle modifcations including regular physical activity/exercise, weight management, moderate sodium restriction and increased consumption of fresh fruit, vegetables, and low fat dairy, alcohol moderation, and smoking cessation.;Monitor prescription use compliance.   Expected Outcomes Short Term: Continued assessment and intervention until BP is < 140/49m HG in hypertensive participants. < 130/868mHG in hypertensive participants with diabetes, heart failure or chronic kidney disease.;Long Term: Maintenance of blood pressure at goal levels.   Lipids Yes   Intervention Provide education and support for participant on nutrition & aerobic/resistive exercise  along with prescribed medications to achieve LDL <7038mHDL >40m58m Expected Outcomes Short Term: Participant states understanding of desired cholesterol values and is compliant with medications prescribed. Participant is following exercise prescription and nutrition guidelines.;Long Term: Cholesterol controlled with medications as prescribed, with individualized exercise RX and with personalized nutrition plan. Value goals: LDL < 70mg48mL > 40 mg.      Core Components/Risk Factors/Patient Goals Review:      Goals and Risk Factor Review    Row Name 07/15/16 1419 08/05/16 1517 08/29/16 1219 09/18/16 1416 10/02/16 1245     Core Components/Risk Factors/Patient Goals Review   Personal Goals Review Weight Management/Obesity;Develop more efficient breathing techniques such as purse lipped breathing and diaphragmatic breathing and practicing self-pacing with activity.;Improve shortness of breath with ADL's Weight Management/Obesity;Improve shortness of breath with ADL's;Develop more efficient breathing techniques such as purse lipped breathing and diaphragmatic breathing and practicing self-pacing with activity. Weight Management/Obesity;Improve shortness of breath with ADL's;Increase knowledge of respiratory medications and ability to use respiratory devices properly.;Lipids;Hypertension;Develop more efficient breathing techniques such as purse lipped breathing and diaphragmatic breathing and practicing self-pacing with activity. Weight Management/Obesity;Improve shortness of breath with ADL's;Lipids;Hypertension;Develop more efficient breathing techniques such as purse lipped breathing and diaphragmatic breathing and practicing self-pacing with activity.;Increase knowledge of respiratory medications and ability to use respiratory devices properly. Weight Management/Obesity;Improve shortness of breath with ADL's;Heart Failure;Other;Hypertension   Review Bradley HendeKeatscompleted 3 sessions of [facility]. He  is cued to do PLB and does have shortness of breath with most activity. He would like to mow at home, but with the heat and shortness of breath, he has not been able to mow, but he hopes to in the future [place]lbs, does not want to meet with the dietitian at this time, and states his wife cooks healthy.[facility] days. Bradley. HendeGraingerbeen  working hard the last few sessions pushing himself. He states some days are better than others with his shortness of breath but it has not been too bad. Bradley Barnes needs to work on his PLB techniques when he gets short of breath. His exercise has been going well but feels like he needs to improve more. His blood pressure has been stable. He is using his respiratory medications as prescribed with a spacer. His weight has not changed much since the start of the program and sometimes states he eats too much. Bradley Barnes has been working on losing weight and he has lost about 5 pounds recently. He states he has been eating less and working with his wife to keep his weight off. He has been walking more laps in the gym.  Bradley Barnes states his weight was up today due to eating at a buffet while out of town.  He did not have any shortness of breath.  Other than eating out, Bradley Barnes reports he doesnt use salt at all. His BP has been good in class.    Expected Outcomes Long term: Continue to attend [facility] to increase his stamina and conditioning to improve his home exercise; Short: Learn to use PLB without cueing. Long term: Continue to attend [facility] to increase his strength to improve his home exercise routine at the [facility]; Short: Learn to use PLB without cueing. Lose weight for easier breathing. Short: Work on PLB techniques. Lose more weight. Increase exercise routine. Long: use PLB without cueing. Increase his SOB and his ADL Short: Continue to lose weight and  work on PLB techniques. Long: maintain weight loss and use PLB without cueing Short - Bradley Barnes will get back to original weight by returning to usual habits.  Long - Bradley Barnes will maintain healthy lifestyle choices.   [place] Name 11/06/16 1449             Core Components/Risk Factors/Patient Goals Review   Personal Goals Review Weight Management/Obesity;Improve shortness of breath with ADL's;Hypertension       Review Bradley Barnes has lost some weight this past month by eating better portions. His blood pressure has been under control and has been at adequate levels. Bradley Barnes feels like he has only improved slighty throughout the program.       Expected Outcomes Short: Improve SOB with ADL's. Long: Adhere to an exercise plane to improve ADL.          Core Components/Risk Factors/Patient Goals at Discharge (Final Review):      Goals and Risk Factor Review - 11/06/16 1449      Core Components/Risk Factors/Patient Goals Review   Personal Goals Review Weight Management/Obesity;Improve shortness of breath with ADL's;Hypertension   Review Bradley Barnes has lost some weight this past month by eating better portions. His blood pressure has been under control and has been at adequate levels. Bradley Barnes feels like he has only improved slighty throughout the program.   Expected Outcomes Short: Improve SOB with ADL's. Long: Adhere to an exercise plane to improve ADL.      ITP Comments:     ITP Comments    Row Name 07/10/16 1306 07/22/16 0741 08/02/16 1345 08/19/16 0851 09/16/16 0816   ITP Comments Bradley Barnes has been absent due to other Dr appointments. 30 day note review with Medical Director of [place] Dr. Emily Filbert  Reviewed home exercise with pt today.  Pt plans to walk  for exercise.  Reviewed THR, pulse, RPE, sign and symptoms, NTG use, and when to call 911 or  MD.  Also discussed weather considerations and indoor options.  Pt voiced understanding. 30 day review completed ITP sent to Dr. Ramonita Lab for Dr. Emily Filbert Director of  [place]. Continue with ITP unless changes are made by physician.  30 day review completed. ITP sent to Dr. Emily Filbert Director of [place]. Continue with ITP unless changes are made by physician.     Row Name 10/11/16 1019 10/14/16 0822 10/16/16 1011 11/01/16 1023 11/11/16 0817   ITP Comments He will be out Monday and Friday next week 30 day review completed. ITP sent to Dr. Emily Filbert Director of [place]. Continue with ITP unless changes are made by physician.   Bradley Barnes will be out all next week due to doctor appointments. Bradley Barnes states he will be out Monday. 30 day review completed. ITP sent to Dr. Emily Filbert Director of [place]. Continue with ITP unless changes are made by physician.        Comments: 30 day review

## 2016-11-11 NOTE — Progress Notes (Signed)
Daily Session Note  Patient Details  Name: Bradley Barnes. MRN: [id number] Date of Birth: 1940-08-02 Referring Provider:     Pulmonary Rehab from 06/25/2016 in [facility]  Referring Provider  Ramonita Lab MD      Encounter Date: 11/11/2016  Check In:     Session Check In - 11/11/16 1017      Check-In   Location [facility]-Cardiac & Pulmonary Rehab   Staff Present Nada Maclachlan, BA, ACSM CEP, Exercise Physiologist;Kelly Amedeo Plenty, BS, ACSM CEP, Exercise Physiologist;Candence Sease Flavia Shipper   Supervising physician immediately available to respond to emergencies [facility] immediately available ER MD   Physician(s) Dr. Kerman Passey and Mariea Clonts   Medication changes reported     No   Fall or balance concerns reported    No   Warm-up and Cool-down Performed as group-led instruction   Resistance Training Performed Yes   VAD Patient? No     Pain Assessment   Currently in Pain? No/denies   Multiple Pain Sites No         History  Smoking Status  . Former Smoker  . Packs/day: 1.00  . Years: 33.00  . Types: Cigarettes  . Quit date: 10/03/1991  Smokeless Tobacco  . Never Used    Goals Met:  Independence with exercise equipment Exercise tolerated well No report of cardiac concerns or symptoms Strength training completed today  Goals Unmet:  Not Applicable  Comments: Pt able to follow exercise prescription today without complaint.  Will continue to monitor for progression.   Dr. Emily Filbert is Medical Director for [place] and [facility].

## 2016-11-13 VITALS — Ht 74.0 in | Wt 263.4 lb

## 2016-11-13 DIAGNOSIS — J449 Chronic obstructive pulmonary disease, unspecified: Secondary | ICD-10-CM

## 2016-11-13 NOTE — Progress Notes (Signed)
Pulmonary Individual Treatment Plan  Patient Details  Name: Bradley Barnes. MRN: [id number] Date of Birth: May 10, 1940 Referring Provider:     Pulmonary Rehab from 06/25/2016 in [facility]  Referring Provider  Ramonita Lab MD      Initial Encounter Date:    Pulmonary Rehab from 06/25/2016 in [facility]  Date  06/25/16  Referring Provider  Ramonita Lab MD      Visit Diagnosis: Chronic obstructive pulmonary disease, unspecified COPD type ([place])  Patient's Home Medications on Admission:  Current Outpatient Prescriptions:  .  Acetaminophen 500 MG coapsule, Take by mouth., Disp: , Rfl:  .  albuterol (PROAIR HFA) 108 (90 Base) MCG/ACT inhaler, Inhale into the lungs., Disp: , Rfl:  .  Alum Hydroxide-Mag Carbonate (GAVISCON EXTRA RELIEF FORMULA PO), Take by mouth., Disp: , Rfl:  .  amLODipine (NORVASC) 10 MG tablet, Take by mouth., Disp: , Rfl:  .  aspirin EC 81 MG tablet, Take by mouth., Disp: , Rfl:  .  atorvastatin (LIPITOR) 40 MG tablet, Take by mouth., Disp: , Rfl:  .  furosemide (LASIX) 40 MG tablet, Take 40 mg by mouth., Disp: , Rfl:  .  Glycopyrrolate-Formoterol (BEVESPI AEROSPHERE) 9-4.8 MCG/ACT AERO, Inhale 2 puffs into the lungs 2 (two) times daily., Disp: 10.7 g, Rfl: 5 .  Investigational - Study Medication, Take by mouth. [facility] IDS, Disp: , Rfl:  .  isosorbide mononitrate (IMDUR) 60 MG 24 hr tablet, Take by mouth., Disp: , Rfl:  .  metoprolol (TOPROL-XL) 200 MG 24 hr tablet, Take by mouth., Disp: , Rfl:  .  mexiletine (MEXITIL) 200 MG capsule, Take 200 mg by mouth 3 (three) times daily., Disp: , Rfl:  .  Multiple Vitamin (MULTI-VITAMINS) TABS, Take by mouth., Disp: , Rfl:  .  nitroGLYCERIN (NITROSTAT) 0.4 MG SL tablet, Place under the tongue., Disp: , Rfl:  .  Omega-3 1000 MG CAPS, Take by mouth., Disp: , Rfl:  .  ramipril (ALTACE) 10 MG capsule, Take by mouth., Disp: , Rfl:  .  ranitidine (ZANTAC) 300 MG tablet, Take by  mouth., Disp: , Rfl:  .  sodium chloride (OCEAN) 0.65 % nasal spray, Place 1 spray into the nose as needed for congestion., Disp: , Rfl:  .  sodium chloride (V-R NASAL SPRAY SALINE) 0.65 % nasal spray, , Disp: , Rfl:  .  tamsulosin (FLOMAX) 0.4 MG CAPS capsule, Take 0.4 mg by mouth., Disp: , Rfl:  .  tiotropium (SPIRIVA) 18 MCG inhalation capsule, Place into inhaler and inhale., Disp: , Rfl:  .  Tiotropium Bromide Monohydrate (SPIRIVA RESPIMAT) 2.5 MCG/ACT AERS, Inhale 2 puffs into the lungs daily., Disp: 1 Inhaler, Rfl: 0 .  UNABLE TO FIND, Place 1 drop into both eyes daily. Med Name: Dextran 70, Disp: , Rfl:   Past Medical History: Past Medical History:  Diagnosis Date  . Chronic kidney disease   . COPD (chronic obstructive pulmonary disease) ([place])   . Hyperlipidemia   . Hypertension   . Myocardial infarction   . Sleep apnea     Tobacco Use: History  Smoking Status  . Former Smoker  . Packs/day: 1.00  . Years: 33.00  . Types: Cigarettes  . Quit date: 10/03/1991  Smokeless Tobacco  . Never Used    Labs: Recent Review Flowsheet Data    There is no flowsheet data to display.       Pulmonary Assessment Scores:     Pulmonary Assessment Scores  [place] Name 06/25/16 1519 08/12/16 1354 11/06/16 1018     ADL UCSD   ADL Phase Entry Mid Exit   SOB Score total 52 47 46   Rest 0 1 0   Walk _0 Stairs _1 Bath _2 Dress 2 0 1   Shop _3 mMRC Score   mMRC Score 2  - 2      Pulmonary Function Assessment:     Pulmonary Function Assessment - 06/25/16 1517      Pulmonary Function Tests   RV% 84 %   DLCO% 50 %     Initial Spirometry Results   FVC% 62 %   FEV1% 55 %   FEV1/FVC Ratio 64   Comments Test date 09/20/15     Post Bronchodilator Spirometry Results   FVC% 60 %   FEV1% 57 %   FEV1/FVC Ratio 68     Breath   Bilateral Breath Sounds Clear   Shortness of Breath Yes;Fear of Shortness of Breath;Limiting activity      Exercise Target  Goals:    Exercise Program Goal: Individual exercise prescription set with THRR, safety & activity barriers. Participant demonstrates ability to understand and report RPE using BORG scale, to self-measure pulse accurately, and to acknowledge the importance of the exercise prescription.  Exercise Prescription Goal: Starting with aerobic activity 30 plus minutes a day, 3 days per week for initial exercise prescription. Provide home exercise prescription and guidelines that participant acknowledges understanding prior to discharge.  Activity Barriers & Risk Stratification:     Activity Barriers & Cardiac Risk Stratification - 06/25/16 1426      Activity Barriers & Cardiac Risk Stratification   Activity Barriers Arthritis;Chest Pain/Angina;Joint Problems;Deconditioning;Muscular Weakness;Right Hip Replacement;Left Knee Replacement;Back Problems;Assistive Device;Shortness of Breath;Decreased Ventricular Function;Balance Concerns  uses rolling walker for support and balance   Cardiac Risk Stratification High      6 Minute Walk:     6 Minute Walk    Row Name 06/25/16 1421 11/06/16 1040       6 Minute Walk   Phase Initial Discharge    Distance 730 feet 925 feet    Distance % Change  - 27 %    Distance Feet Change  - 195 ft    Walk Time 6 minutes 6 minutes    # of Rest Breaks 0 0    MPH 1.38 1.75    METS 0 1.36    RPE 16 14    Perceived Dyspnea  3 3    VO2 Peak 3.34 4.76    Symptoms Yes (comment) Yes (comment)    Comments short of breath, using rolling walker SHORT OF BREATH, USING WALKER    Resting HR 57 bpm 57 bpm    Resting BP 134/64  -    Resting Oxygen Saturation   - 98 %    Exercise Oxygen Saturation  during 6 min walk  - 97 %    Max Ex. HR 72 bpm 71 bpm    Max Ex. BP 144/74 146/72    2 Minute Post BP 126/70  -      Interval HR   Baseline HR (retired) 57  -    1 Minute HR  - 63    2 Minute HR  - 68    3 Minute HR  - 70    4 Minute HR 72  -    5  Minute HR 72 71     6 Minute HR 72 70    2 Minute Post HR 56  -    Interval Heart Rate? Yes Yes      Interval Oxygen   Interval Oxygen? Yes  -    Baseline Oxygen Saturation % 98 % 98 %    Resting Liters of Oxygen 0 L  Room Air  -    1 Minute Oxygen Saturation %  - 99 %    1 Minute Liters of Oxygen 0 L 0 L    2 Minute Oxygen Saturation %  - 98 %    2 Minute Liters of Oxygen 0 L 0 L    3 Minute Oxygen Saturation % 90 % 97 %    3 Minute Liters of Oxygen 0 L 0 L    4 Minute Oxygen Saturation % 95 %  -    4 Minute Liters of Oxygen 0 L 0 L    5 Minute Oxygen Saturation % 99 % 98 %    5 Minute Liters of Oxygen 0 L 0 L    6 Minute Oxygen Saturation % 99 % 98 %    6 Minute Liters of Oxygen 0 L 0 L    2 Minute Post Oxygen Saturation % 100 % 98 %    2 Minute Post Liters of Oxygen 0 L 0 L      Oxygen Initial Assessment:     Oxygen Initial Assessment - 06/25/16 1522      Home Oxygen   Home Oxygen Device None      Oxygen Re-Evaluation:     Oxygen Re-Evaluation    Row Name 07/15/16 1419 08/05/16 1522 08/29/16 1243 10/30/16 1042       Program Oxygen Prescription   Program Oxygen Prescription None None None None      Home Oxygen   Home Oxygen Device None None None None    Sleep Oxygen Prescription  - None None CPAP  wears Nasal Mask    Liters per minute  -  -  - 0    Home Exercise Oxygen Prescription  - None None None    Home at Rest Exercise Oxygen Prescription  - None None None      Goals/Expected Outcomes   Short Term Goals  - To learn and understand importance of monitoring SPO2 with pulse oximeter and demonstrate accurate use of the pulse oximeter.;To Learn and understand importance of maintaining oxygen saturations>88%;To learn and demonstrate proper purse lipped breathing techniques or other breathing techniques.;To learn and demonstrate proper use of respiratory medications To learn and understand importance of monitoring SPO2 with pulse oximeter and demonstrate accurate use of the pulse  oximeter.;To Learn and understand importance of maintaining oxygen saturations>88%;To learn and demonstrate proper purse lipped breathing techniques or other breathing techniques.;To learn and demonstrate proper use of respiratory medications To learn and understand importance of maintaining oxygen saturations>88%;To learn and understand importance of monitoring SPO2 with pulse oximeter and demonstrate accurate use of the pulse oximeter.;To learn and demonstrate proper pursed lip breathing techniques or other breathing techniques.;To learn and demonstrate proper use of respiratory medications    Long  Term Goals  - Exhibits compliance with exercise, home and travel O2 prescription;Maintenance of O2 saturations>88%;Compliance with respiratory medication;Verbalizes importance of monitoring SPO2 with pulse oximeter and return demonstration;Exhibits proper breathing techniques, such as purse lipped breathing or other method taught during program session;Demonstrates proper use of MDI's Compliance with respiratory medication;Maintenance of O2  saturations>88%;Exhibits proper breathing techniques, such as purse lipped breathing or other method taught during program session;Demonstrates proper use of MDI's;Verbalizes importance of monitoring SPO2 with pulse oximeter and return demonstration Maintenance of O2 saturations>88%;Compliance with respiratory medication;Demonstrates proper use of MDI's;Exhibits proper breathing techniques, such as pursed lip breathing or other method taught during program session;Verbalizes importance of monitoring SPO2 with pulse oximeter and return demonstration    Comments  - Patient has been doing well with his O2 saturations since he has been in the program. Patient feels like he needs to work on losing weight. Bill's O2 saturation has not dropped below 90% while in the program. He is checking his oxygen at home with his pulse ox regularly. He is taking his repiratory medications with  a spacer. Rush Landmark states he still wears his CPAP everytime he sleeps. Rush Landmark states he is using his CPAP everynight. He does not wear oxygen at home. He has his own pulse oximeter and checks it in the morning and when he is walking. He verbalizes that his oxygen level needs to be 88 percent and above. He has been better at using PLB. Rush Landmark said he ised PLB when he cut down a small tree the other day. Rush Landmark takes his respiratory medications as prescribed and uses them properly.    Goals/Expected Outcomes  - Short term: Understand when to check and the importance of monitoring O2 saturation. Long Term: use proper breathing techniques and monitor his own O2 saturation.  Short: continue to work on proper breathing techniques. Long. Be proficient in PLB techniques. Short: Check oxygen at home to maintain oxygen above 88 percent. Long: maintain checking oxygen at home regularly       Oxygen Discharge (Final Oxygen Re-Evaluation):     Oxygen Re-Evaluation - 10/30/16 1042      Program Oxygen Prescription   Program Oxygen Prescription None     Home Oxygen   Home Oxygen Device None   Sleep Oxygen Prescription CPAP  wears Nasal Mask   Liters per minute 0   Home Exercise Oxygen Prescription None   Home at Rest Exercise Oxygen Prescription None     Goals/Expected Outcomes   Short Term Goals To learn and understand importance of maintaining oxygen saturations>88%;To learn and understand importance of monitoring SPO2 with pulse oximeter and demonstrate accurate use of the pulse oximeter.;To learn and demonstrate proper pursed lip breathing techniques or other breathing techniques.;To learn and demonstrate proper use of respiratory medications   Long  Term Goals Maintenance of O2 saturations>88%;Compliance with respiratory medication;Demonstrates proper use of MDI's;Exhibits proper breathing techniques, such as pursed lip breathing or other method taught during program session;Verbalizes importance of monitoring  SPO2 with pulse oximeter and return demonstration   Comments Rush Landmark states he is using his CPAP everynight. He does not wear oxygen at home. He has his own pulse oximeter and checks it in the morning and when he is walking. He verbalizes that his oxygen level needs to be 88 percent and above. He has been better at using PLB. Rush Landmark said he ised PLB when he cut down a small tree the other day. Rush Landmark takes his respiratory medications as prescribed and uses them properly.   Goals/Expected Outcomes Short: Check oxygen at home to maintain oxygen above 88 percent. Long: maintain checking oxygen at home regularly      Initial Exercise Prescription:     Initial Exercise Prescription - 06/25/16 1400      Date of Initial Exercise RX and Referring Provider  Date 06/25/16   Referring Provider Ramonita Lab MD     Treadmill   MPH 1.1   Grade 0   Minutes 15   METs 1.84     Bike   Level 0.5   Watts 10   Minutes 15   METs 1.5     T5 Nustep   Level 1   SPM 80   Minutes 15   METs 1.5     Track   Laps 15   Minutes 15   METs 1.7     Prescription Details   Frequency (times per week) 3   Duration Progress to 45 minutes of aerobic exercise without signs/symptoms of physical distress     Intensity   THRR 40-80% of Max Heartrate 91-127   Ratings of Perceived Exertion 11-13   Perceived Dyspnea 0-4     Progression   Progression Continue to progress workloads to maintain intensity without signs/symptoms of physical distress.     Resistance Training   Training Prescription Yes   Weight 3 lbs   Reps 10-15      Perform Capillary Blood Glucose checks as needed.  Exercise Prescription Changes:      Exercise Prescription Changes    Row Name 07/03/16 1200 07/23/16 1300 08/02/16 1300 08/07/16 1500 08/23/16 0900     Response to Exercise   Blood Pressure (Admit) 102/64 112/62  - 124/72 126/68   Blood Pressure (Exercise) 146/64 120/70  - 142/84 142/70   Blood Pressure (Exit) 104/58 102/60  -  132/76 132/74   Heart Rate (Admit) 56 bpm 57 bpm  - 61 bpm 54 bpm   Heart Rate (Exercise) 64 bpm 60 bpm  - 65 bpm 64 bpm   Heart Rate (Exit) 54 bpm 50 bpm  - 50 bpm 54 bpm   Oxygen Saturation (Admit) 100 % 98 %  - 98 % 98 %   Oxygen Saturation (Exercise) 99 % 198 %  - 97 % 97 %   Oxygen Saturation (Exit) 100 % 98 %  - 99 % 98 %   Rating of Perceived Exertion (Exercise) 14 14  - 14 14   Perceived Dyspnea (Exercise) 2 2.5  - 2 2   Duration Progress to 45 minutes of aerobic exercise without signs/symptoms of physical distress Progress to 45 minutes of aerobic exercise without signs/symptoms of physical distress  - Continue with 45 min of aerobic exercise without signs/symptoms of physical distress. Continue with 45 min of aerobic exercise without signs/symptoms of physical distress.   Intensity THRR unchanged THRR unchanged  - THRR unchanged Other (comment)  PM and meds keep Bill from attaining THR     Progression   Progression Continue to progress workloads to maintain intensity without signs/symptoms of physical distress. Continue to progress workloads to maintain intensity without signs/symptoms of physical distress.  - Continue to progress workloads to maintain intensity without signs/symptoms of physical distress. Continue to progress workloads to maintain intensity without signs/symptoms of physical distress.   Average METs  - 2.38  - 3.28 1.8     Resistance Training   Training Prescription Yes Yes  - Yes Yes   Weight 4 3  - 3 4   Reps 10-15 10-15  - 10-15 10-15   Time  -  -  - 2.29 Minutes  -     Interval Training   Interval Training  - No  - No No     Oxygen   Liters  - 0  -  -  -  Bike   Level 0.5  -  -  -  -   Watts 10 31  - 50 50   Minutes 15 15  - 15 15   METs  - 2.76  - 3.28 3.28     T5 Nustep   Level 1 2  -  - 3   SPM 103 95  -  - 74   Minutes 15 15  -  - 15   METs 2.1 2  -  - 1.9     Track   Laps  -  -  - 20 17   Minutes  -  -  - 15 15   METs  -  -  - 1.92  1.77     Home Exercise Plan   Plans to continue exercise at  -  - [employer] (comment)  walk at [place]  -  -   Frequency  -  - Add 2 additional days to program exercise sessions.  -  -   Initial Home Exercises Provided  -  - 08/02/16  -  -   [place] Name 09/04/16 1200 09/18/16 1200 10/02/16 1200 10/16/16 1400 10/30/16 1300     Response to Exercise   Blood Pressure (Admit) 125/56 134/64 122/78 140/68 130/20   Blood Pressure (Exit) 138/62 124/74 126/68 128/70 128/66   Heart Rate (Admit) 54 bpm 59 bpm 55 bpm 59 bpm 54 bpm   Heart Rate (Exercise) 61 bpm 64 bpm 67 bpm 65 bpm 61 bpm   Heart Rate (Exit) 58 bpm 54 bpm 98 bpm 98 bpm 54 bpm   Oxygen Saturation (Admit) 97 % 97 % 97 % 95 % 98 %   Oxygen Saturation (Exercise) 98 % 98 % 98 % 96 % 99 %   Oxygen Saturation (Exit) 97 % 98 % 98 % 98 % 99 %   Rating of Perceived Exertion (Exercise) _0 Perceived Dyspnea (Exercise) _1 Duration Continue with 45 min of aerobic exercise without signs/symptoms of physical distress. Continue with 45 min of aerobic exercise without signs/symptoms of physical distress. Continue with 45 min of aerobic exercise without signs/symptoms of physical distress. Continue with 45 min of aerobic exercise without signs/symptoms of physical distress. Continue with 45 min of aerobic exercise without signs/symptoms of physical distress.   Intensity Other (comment)  pacemaker Other (comment) Other (comment)  PM Other (comment) Other (comment)  PM keeps HR low     Progression   Progression Continue to progress workloads to maintain intensity without signs/symptoms of physical distress. Continue to progress workloads to maintain intensity without signs/symptoms of physical distress. Continue to progress workloads to maintain intensity without signs/symptoms of physical distress. Continue to progress workloads to maintain intensity without signs/symptoms of physical distress. Continue to progress  workloads to maintain intensity without signs/symptoms of physical distress.   Average METs 2.75 2.5  - 2.53 2.4     Resistance Training   Training Prescription Yes Yes  - Yes Yes   Weight 4 4  - 4 4 lb   Reps 10-15 10-15  - 10-15 10-15     Interval Training   Interval Training No No  -  - No     Bike   Watts 58 63 50 63 58   Minutes _2 METs 3.57 3.6 3.28 3.64 3.5     T5 Nustep   Level  -  3  - 3 3   SPM  - 60  - 61 59   Minutes  - 15  - 15 15   METs  - 2  - 2.1 2     Track   Laps _0 Minutes _1 METs 1.92 2.15 2 1.84 1.92      Exercise Comments:      Exercise Comments    Row Name 07/03/16 1247           Exercise Comments First full day of exercise!  Patient was oriented to gym and equipment including functions, settings, policies, and procedures.  Patient's individual exercise prescription and treatment plan were reviewed.  All starting workloads were established based on the results of the 6 minute walk test done at initial orientation visit.  The plan for exercise progression was also introduced and progression will be customized based on patient's performance and goals.          Exercise Goals and Review:      Exercise Goals    Row Name 06/25/16 1428             Exercise Goals   Increase Physical Activity Yes       Intervention Provide advice, education, support and counseling about physical activity/exercise needs.;Develop an individualized exercise prescription for aerobic and resistive training based on initial evaluation findings, risk stratification, comorbidities and participant's personal goals.       Expected Outcomes Achievement of increased cardiorespiratory fitness and enhanced flexibility, muscular endurance and strength shown through measurements of functional capacity and personal statement of participant.       Increase Strength and Stamina Yes       Intervention Provide advice, education, support and  counseling about physical activity/exercise needs.;Develop an individualized exercise prescription for aerobic and resistive training based on initial evaluation findings, risk stratification, comorbidities and participant's personal goals.       Expected Outcomes Achievement of increased cardiorespiratory fitness and enhanced flexibility, muscular endurance and strength shown through measurements of functional capacity and personal statement of participant.          Exercise Goals Re-Evaluation :     Exercise Goals Re-Evaluation    [place] Name 07/23/16 1319 08/02/16 1346 08/07/16 1517 08/23/16 0936 09/04/16 1237     Exercise Goal Re-Evaluation   Exercise Goals Review Increase Physical Activity;Increase Strenth and Stamina Increase Physical Activity;Increase Strenth and Stamina Increase Physical Activity;Increase Strenth and Stamina Increase Physical Activity;Increase Strenth and Stamina Increase Physical Activity;Increase Strenth and Stamina   Comments Rush Landmark is tolerating exercise well so far.    Reviewed home exercise with pt today.  Pt plans to walk  for exercise.  Reviewed THR, pulse, RPE, sign and symptoms, NTG use, and when to call 911 or MD.  Also discussed weather considerations and indoor options.  Pt voiced understanding. Rush Landmark is increasing his MET level overall.  He is walking at the [facility] on days he doesnt attend  LW. Bill's pacemaker and medications keep him from reaching his target HR.  His RPE indicates he is working at a proper level. Rush Landmark has inceased his overall MET level slowly.  He is also walking on days he doesnt attend LW.   Expected Outcomes Short- Rush Landmark will increase his stamina for ADLs.  Long - Rush Landmark will continue to exercise regularly outside of LW. Short - pt will continue to exercise on days not at [place] - Pt  will cotinue to exercise after completing the program. Short - Rush Landmark will continue regular attendance at [facility].  Long - Rush Landmark will graduate and exercise on  his own. Short - Rush Landmark will continue to exercise in LW.  Long - Rush Landmark will complete LW and exercise on his own. Short - Rush Landmark will continue to exercise outside LW class.  Long - Rush Landmark will continue to exercise on his own after completing LW.   [place] Name 09/18/16 1250 10/02/16 1243 10/16/16 1448 10/30/16 1407       Exercise Goal Re-Evaluation   Exercise Goals Review Increase Physical Activity;Increase Strength and Stamina;Able to understand and use rate of perceived exertion (RPE) scale;Able to understand and use Dyspnea scale Increase Physical Activity;Increase Strength and Stamina Increase Strength and Stamina Increase Physical Activity;Increase Strength and Stamina;Able to understand and use rate of perceived exertion (RPE) scale;Knowledge and understanding of Target Heart Rate Range (THRR);Able to understand and use Dyspnea scale    Comments Rush Landmark has been able to walk more laps at the [employer] center.  He is progressing well with exercise. Rush Landmark is stil walking at [facility] when possible outside of class. Rush Landmark continues to tolerate exercise well.   Rush Landmark is maintaining his MET level.  He aso walks on days he does not attend LW.    Expected Outcomes Short - Rush Landmark will continue to increase his walking.  Long - Rush Landmark will continue exercise on his own. Short - Rush Landmark will continue to exercise in class and on his own.  Long - Rush Landmark will maintain exercise on his own. Short - Rush Landmark will continue to attend regularly.  Long - Rush Landmark will finish LW and exercise on his own. Short - Rush Landmark will finish LW.  Long - Rush Landmark will exercise on his own.       Discharge Exercise Prescription (Final Exercise Prescription Changes):     Exercise Prescription Changes - 10/30/16 1300      Response to Exercise   Blood Pressure (Admit) 130/20   Blood Pressure (Exit) 128/66   Heart Rate (Admit) 54 bpm   Heart Rate (Exercise) 61 bpm   Heart Rate (Exit) 54 bpm   Oxygen Saturation (Admit) 98 %   Oxygen Saturation (Exercise) 99 %    Oxygen Saturation (Exit) 99 %   Rating of Perceived Exertion (Exercise) 14   Perceived Dyspnea (Exercise) 3   Duration Continue with 45 min of aerobic exercise without signs/symptoms of physical distress.   Intensity Other (comment)  PM keeps HR low     Progression   Progression Continue to progress workloads to maintain intensity without signs/symptoms of physical distress.   Average METs 2.4     Resistance Training   Training Prescription Yes   Weight 4 lb   Reps 10-15     Interval Training   Interval Training No     Bike   Watts 58   Minutes 15   METs 3.5     T5 Nustep   Level 3   SPM 59   Minutes 15   METs 2     Track   Laps 23   Minutes 15   METs 1.92      Nutrition:  Target Goals: Understanding of nutrition guidelines, daily intake of sodium <1524m, cholesterol <2059m calories 30% from fat and 7% or less from saturated fats, daily to have 5 or more servings of fruits and vegetables.  Biometrics:     Pre Biometrics - 06/25/16 1428  Pre Biometrics   Height '6\' 2"'$  (1.88 m)   Weight 271 lb 3.2 oz (123 kg)   Waist Circumference 52 inches   Hip Circumference 44 inches   Waist to Hip Ratio 1.18 %   BMI (Calculated) 34.9         Post Biometrics - 11/13/16 1412       Post  Biometrics   Height '6\' 2"'$  (1.88 m)   Weight 263 lb 6.4 oz (119.5 kg)   Waist Circumference 51.5 inches   Hip Circumference 47 inches   Waist to Hip Ratio 1.1 %   BMI (Calculated) 33.8      Nutrition Therapy Plan and Nutrition Goals:     Nutrition Therapy & Goals - 08/05/16 1527      Nutrition Therapy   RD appointment defered Yes     Personal Nutrition Goals   Comments patient states his Wife is a nutritionist     Intervention Plan   Intervention Nutrition handout(s) given to patient.   Expected Outcomes Long Term Goal: Adherence to prescribed nutrition plan.;Short Term Goal: Understand basic principles of dietary content, such as calories, fat, sodium,  cholesterol and nutrients.      Nutrition Discharge: Rate Your Plate Scores:     Nutrition Assessments - 11/06/16 1020      MEDFICTS Scores   Pre Score 3      Nutrition Goals Re-Evaluation:     Nutrition Goals Re-Evaluation    Row Name 08/29/16 1250 11/01/16 1149           Goals   Current Weight 267 lb (121.1 kg) 260 lb (117.9 kg)      Nutrition Goal  - To eat less portions and lose weight.      Comment When asked to meet with the Dietician he states his wife was a nutritionist and she helps him.  Bill declines to meet with the dietician. He states he has been eating smaller portions. He does not use salt and has been limiting his red meats. He has lost some weight and is determined to keep going.      Expected Outcome Short: lose weight by dieting better at home with the help of his wife. Long: Maintain a healthy diet for weight loss Short: decrease portion size of meals. Long: Maintain a healthy eating lifestyle.         Nutrition Goals Discharge (Final Nutrition Goals Re-Evaluation):     Nutrition Goals Re-Evaluation - 11/01/16 1149      Goals   Current Weight 260 lb (117.9 kg)   Nutrition Goal To eat less portions and lose weight.   Comment Bill declines to meet with the dietician. He states he has been eating smaller portions. He does not use salt and has been limiting his red meats. He has lost some weight and is determined to keep going.   Expected Outcome Short: decrease portion size of meals. Long: Maintain a healthy eating lifestyle.      Psychosocial: Target Goals: Acknowledge presence or absence of significant depression and/or stress, maximize coping skills, provide positive support system. Participant is able to verbalize types and ability to use techniques and skills needed for reducing stress and depression.   Initial Review & Psychosocial Screening:     Initial Psych Review & Screening - 06/25/16 [place]?  Yes   Comments Mr Morey has good support from his wife and children. 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 and ICU hospitalization in June. Mr Stamper is looking forward to supervised exercise and learning more about his COPD.     Barriers   Psychosocial barriers to participate in program There are no identifiable barriers or psychosocial needs.;The patient should benefit from training in stress management and relaxation.     Screening Interventions   Interventions Encouraged to exercise      Quality of Life Scores:     Quality of Life - 11/06/16 1024      Quality of Life Scores   Health/Function Pre 20.06 %   Health/Function Post 18.56 %   Health/Function % Change -7.48 %   Socioeconomic Pre 19.83 %   Socioeconomic Post 24.21 %   Socioeconomic % Change  22.09 %   Psych/Spiritual Pre 20.33 %   Psych/Spiritual Post 23.71 %   Psych/Spiritual % Change 16.63 %   Family Pre 21 %   Family Post 25.2 %   Family % Change 20 %   GLOBAL Pre 20.19 %   GLOBAL Post 21.67 %   GLOBAL % Change 7.33 %      PHQ-9: Recent Review Flowsheet Data    Depression screen [facility] 2/9 11/06/2016 06/25/2016 09/26/2015 08/07/2015   Decreased Interest 0 _0 Down, Depressed, Hopeless 0 0 0 0   PHQ - 2 Score 0 _1 Altered sleeping _2 Tired, decreased energy _3 Change in appetite 0 1 2 0   Feeling bad or failure about yourself  0 0 0 0   Trouble concentrating _4 Moving slowly or fidgety/restless 0 0 1 0   Suicidal thoughts 0 0 0 0   PHQ-9 Score _5 Difficult doing work/chores Somewhat difficult Very difficult Somewhat difficult Somewhat difficult     Interpretation of Total Score  Total Score Depression Severity:  1-4 = Minimal depression, 5-9 = Mild depression, 10-14 = Moderate depression, 15-19 = Moderately severe depression, 20-27 = Severe depression   Psychosocial Evaluation and Intervention:     Psychosocial Evaluation - 11/13/16 1359       Discharge Psychosocial Assessment & Intervention   Comments Rush Landmark has done great in the [facility] program. His wife has been very positive for him and guiding him to eat better. Rush Landmark will continue to exercise now that he is graduating. He has a good outlook on life and will do very well at managing his own stress.      Psychosocial Re-Evaluation:     Psychosocial Re-Evaluation    Row Name 07/15/16 1428 08/05/16 1530 08/19/16 1117 08/28/16 1128 08/29/16 1253     Psychosocial Re-Evaluation   Current issues with  - None Identified  -  - None Identified   Comments Mr Dufresne has attended [facility] 3 sessions. He is still nervous about not wearing a heart monitor due to his past heart codes. He does have a strong [place] faith and has had several people praying for him which has helped the most with his anxiety of another cardiac episode.  He also worries that he is a burden to his wife, since Mr Alpert can not drive and she has her own health conditions.  Mr. Weaver seems reserved about his feelings but has been talking about his activities of his daily life. Its healthy for Mr. Kobus to talk about his daily life to develope  better communication between staff and himself. Counselor follow with Rush Landmark today reporting he is noticing some improvement with his stamina as he walks on his "off days" here and states he can go further and longer than before coming into this program.  He continues to sleep okay and his mood is stable - although he went to a funeral of a 65 year old friend this past weekend and was sad about this today.  Rush Landmark states his stress currently is his spouse's new dog who is so big and is "dragging her" when she walks him - which concerns him for his spouse's safety.  Discussed ways to cope with this and his recent loss.  Counselor commended Engineer, technical sales on his progress made and commitment to consistent exercise.  Counselor follow up with Rush Landmark today reporting the education portion of  this program has been tremendously helpful in learning how to breathe better; what is COPD; and how to manage his disease in a more positive way.  Counselor commended Engineer, technical sales on his participation in every aspect of this program.    -   Expected Outcomes Short: continue exercising and learning from the health lectures. Long term: in 3 months, Mr Evett will be driving and will share responsibilities with home activities. Short Term: Continue to exercise and have a positive outlook. Long Term: Mr. Muse would like to do more things at home such as read, play solitare, and chores. Rush Landmark will benefit from consistent exercise and reminders of positive ways to cope with stress in his life currently.  - Continue to exercise and stay positive with any stress he may have   Interventions Encouraged to attend [facility] for the exercise;Relaxation education;Stress management education Encouraged to attend [facility] for the exercise Relaxation education;Stress management education  - Encouraged to attend [facility] for the exercise   Continue Psychosocial Services  Follow up required by counselor Follow up required by staff Follow up required by staff  - Follow up required by staff   [place] Name 09/25/16 1116 11/06/16 1455           Psychosocial Re-Evaluation   Current issues with  - None Identified      Comments Counselor follow up today with Bill reporting he has made some progress with some weight loss and having more stamina since coming into this program.  He continues to sleep well and is working on increased balance with a Physical Therapist who gave him some exercises to do.  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  Counselor processeed with Rush Landmark some coping strategies he is using and encouraged use of mindfulness techniques to stay in the moments and not in the past or future.  He is in a positive  mood and counselor commended him for the progress he has made since coming into this program.   Rush Landmark has been positive throughout the program and shows more enthusiasm.       Expected Outcomes Rush Landmark will continue to work on his balance and exercise consistently.  He will also use stress management techniques discussed to cope better with the recent stressors he is experiencing.   Short: keep a positive attitude. Long: Maintain a positive attitude post [facility].      Interventions Stress management education Encouraged to attend Pulmonary Rehabilitation for the exercise      Continue Psychosocial Services   - Follow up required by staff  Psychosocial Discharge (Final Psychosocial Re-Evaluation):     Psychosocial Re-Evaluation - 11/06/16 1455      Psychosocial Re-Evaluation   Current issues with None Identified   Comments Rush Landmark has been positive throughout the program and shows more enthusiasm.    Expected Outcomes Short: keep a positive attitude. Long: Maintain a positive attitude post [facility].   Interventions Encouraged to attend Pulmonary Rehabilitation for the exercise   Continue Psychosocial Services  Follow up required by staff      Education: Education Goals: Education classes will be provided on a weekly basis, covering required topics. Participant will state understanding/return demonstration of topics presented.  Learning Barriers/Preferences:     Learning Barriers/Preferences - 06/25/16 1517      Learning Barriers/Preferences   Learning Barriers Exercise Concerns   Learning Preferences None      Education Topics: Initial Evaluation Education: - Verbal, written and demonstration of respiratory meds, RPE/PD scales, oximetry and breathing techniques. Instruction on use of nebulizers and MDIs: cleaning and proper use, rinsing mouth with steroid doses and importance of monitoring MDI activations.   Pulmonary Rehab from 11/11/2016 in [facility] Cardiac and Pulmonary Rehab   Date  06/25/16  Educator  LB  Instruction Review Code (retired)  2- meets goals/outcomes      General Nutrition Guidelines/Fats and Fiber: -Group instruction provided by verbal, written material, models and posters to present the general guidelines for heart healthy nutrition. Gives an explanation and review of dietary fats and fiber.   Cardiac Rehab from 10/31/2015 in [facility] Cardiac and Pulmonary Rehab  Date  10/10/15  Educator  CR  Instruction Review Code (retired)  R- Review/reinforce [Second Class]      Controlling Sodium/Reading Food Labels: -Group verbal and written material supporting the discussion of sodium use in heart healthy nutrition. Review and explanation with models, verbal and written materials for utilization of the food label.   Pulmonary Rehab from 11/11/2016 in [facility] Cardiac and Pulmonary Rehab  Date  11/11/16  Educator  CR  Instruction Review Code  5- Refused Teaching      Exercise Physiology & Risk Factors: - Group verbal and written instruction with models to review the exercise physiology of the cardiovascular system and associated critical values. Details cardiovascular disease risk factors and the goals associated with each risk factor.   Cardiac Rehab from 10/31/2015 in [facility] Cardiac and Pulmonary Rehab  Date  10/24/15  Educator  [facility]  Instruction Review Code (retired)  2- meets goals/outcomes      Aerobic Exercise & Resistance Training: - Gives group verbal and written discussion on the health impact of inactivity. On the components of aerobic and resistive training programs and the benefits of this training and how to safely progress through these programs.   Cardiac Rehab from 10/31/2015 in [facility] Cardiac and Pulmonary Rehab  Date  10/26/15  Educator  [facility]  Instruction Review Code (retired)  2- Statistician, Balance, General Exercise Guidelines: - Provides group verbal and written instruction on the benefits of flexibility and  balance training programs. Provides general exercise guidelines with specific guidelines to those with heart or lung disease. Demonstration and skill practice provided.   Pulmonary Rehab from 11/11/2016 in [facility] Cardiac and Pulmonary Rehab  Date  09/04/16  Educator  AS  Instruction Review Code  1- Verbalizes Understanding      Stress Management: - Provides group verbal and written instruction about the health risks of elevated stress, cause of high stress, and healthy  ways to reduce stress.   Cardiac Rehab from 10/31/2015 in [facility] Cardiac and Pulmonary Rehab  Date  09/07/15  Educator  CE  Instruction Review Code (retired)  2- meets goals/outcomes      Depression: - Provides group verbal and written instruction on the correlation between heart/lung disease and depressed mood, treatment options, and the stigmas associated with seeking treatment.   Pulmonary Rehab from 11/11/2016 in [facility] Cardiac and Pulmonary Rehab  Instruction Review Code (retired)  Not applicable [Patient states he already had]  Instruction Review Code  5- Refused Teaching      Exercise & Equipment Safety: - Individual verbal instruction and demonstration of equipment use and safety with use of the equipment.   Infection Prevention: - Provides verbal and written material to individual with discussion of infection control including proper hand washing and proper equipment cleaning during exercise session.   Falls Prevention: - Provides verbal and written material to individual with discussion of falls prevention and safety.   Diabetes: - Individual verbal and written instruction to review signs/symptoms of diabetes, desired ranges of glucose level fasting, after meals and with exercise. Advice that pre and post exercise glucose checks will be done for 3 sessions at entry of program.   Chronic Lung Diseases: - Group verbal and written instruction to review new updates, new respiratory medications, new  advancements in procedures and treatments. Provide informative websites and "800" numbers of self-education.   Pulmonary Rehab from 11/11/2016 in [facility] Cardiac and Pulmonary Rehab  Date  08/07/16  Educator  [facility]  Instruction Review Code  1- Verbalizes Understanding      Lung Procedures: - Group verbal and written instruction to describe testing methods done to diagnose lung disease. Review the outcome of test results. Describe the treatment choices: Pulmonary Function Tests, ABGs and oximetry.   Energy Conservation: - Provide group verbal and written instruction for methods to conserve energy, plan and organize activities. Instruct on pacing techniques, use of adaptive equipment and posture/positioning to relieve shortness of breath.   Pulmonary Rehab from 11/11/2016 in [facility] Cardiac and Pulmonary Rehab  Date  07/03/16  Educator  [facility]  Instruction Review Code  1- Verbalizes Understanding      Triggers: - Group verbal and written instruction to review types of environmental controls: home humidity, furnaces, filters, dust mite/pet prevention, HEPA vacuums. To discuss weather changes, air quality and the benefits of nasal washing.   Exacerbations: - Group verbal and written instruction to provide: warning signs, infection symptoms, calling MD promptly, preventive modes, and value of vaccinations. Review: effective airway clearance, coughing and/or vibration techniques. Create an Sports administrator.   Oxygen: - Individual and group verbal and written instruction on oxygen therapy. Includes supplement oxygen, available portable oxygen systems, continuous and intermittent flow rates, oxygen safety, concentrators, and Medicare reimbursement for oxygen.   Respiratory Medications: - Group verbal and written instruction to review medications for lung disease. Drug class, frequency, complications, importance of spacers, rinsing mouth after steroid MDI's, and proper cleaning methods for nebulizers.    Pulmonary Rehab from 11/11/2016 in [facility] Cardiac and Pulmonary Rehab  Date  06/25/16  Educator  LB  Instruction Review Code (retired)  2- meets goals/outcomes      AED/CPR: - Group verbal and written instruction with the use of models to demonstrate the basic use of the AED with the basic ABC's of resuscitation.   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 Applies individual practice skills.   Pulmonary Rehab from 11/11/2016 in [facility] Cardiac and Pulmonary Rehab  Date  06/25/16  Educator  LB  Instruction Review Code  2- Demonstrated Understanding      Anatomy and Physiology of the Lungs: - Group verbal and written instruction with the use of models to provide basic lung anatomy and physiology related to function, structure and complications of lung disease.   Pulmonary Rehab from 11/11/2016 in [facility] Cardiac and Pulmonary Rehab  Date  09/25/16  Educator  [facility]  Instruction Review Code  1- Verbalizes Understanding      Anatomy & Physiology of the Heart: - Group verbal and written instruction and models provide basic cardiac anatomy and physiology, with the coronary electrical and arterial systems. Review of: AMI, Angina, Valve disease, Heart Failure, Cardiac Arrhythmia, Pacemakers, and the ICD.   Cardiac Rehab from 10/31/2015 in [facility] Cardiac and Pulmonary Rehab  Date  09/12/15  Educator  SB  Instruction Review Code (retired)  2- meets goals/outcomes      Heart Failure: - Group verbal and written instruction on the basics of heart failure: signs/symptoms, treatments, explanation of ejection fraction, enlarged heart and cardiomyopathy.   Sleep Apnea: - Individual verbal and written  instruction to review Obstructive Sleep Apnea. Review of risk factors, methods for diagnosing and types of masks and machines for OSA.   Pulmonary Rehab from 11/11/2016 in [facility] Cardiac and Pulmonary Rehab  Date  06/25/16  Educator  LB  Instruction Review Code  1- Verbalizes Understanding      Anxiety: - Provides group, verbal and written instruction on the correlation between heart/lung disease and anxiety, treatment options, and management of anxiety.   Relaxation: - Provides group, verbal and written instruction about the benefits of relaxation for patients with heart/lung disease. Also provides patients with examples of relaxation techniques.   [facility] Cardiac and Pulmonary Rehab  Date  11/06/16  Educator  [facility]  Instruction Review Code  5- Refused Teaching      Cardiac Medications: - Group verbal and written instruction to review commonly prescribed medications for heart disease. Reviews the medication, class of the drug, and side effects.   Cardiac Rehab from 10/31/2015 in [facility] Cardiac and Pulmonary Rehab  Date  09/21/15  Educator  CE  Instruction Review Code (retired)  2- meets goals/outcomes      Know Your Numbers: -Group verbal and written instruction about important numbers in your health.  Review of Cholesterol, Blood Pressure, Diabetes, and BMI and the role they play in your overall health.   Pulmonary Rehab from 11/11/2016 in [facility] Cardiac and Pulmonary Rehab  Date  11/01/16  Educator  [facility]  Instruction Review Code  5- Refused Teaching [states he already had and his wife keeps up with his numbers]      Other: -Provides group and verbal instruction on various topics (see comments)    Knowledge Questionnaire Score:     Knowledge Questionnaire Score - 11/06/16 1020      Knowledge Questionnaire Score   Pre Score 10/10   Post Score 10/10       Core Components/Risk Factors/Patient Goals at Admission:     Personal Goals and Risk  Factors at Admission - 06/25/16 1522      Core  Components/Risk Factors/Patient Goals on Admission    Weight Management Yes;Weight Loss   Intervention Weight Management: Develop a combined nutrition and exercise program designed to reach desired caloric intake, while maintaining appropriate intake of nutrient and fiber, sodium and fats, and appropriate energy expenditure required for the weight goal.;Weight Management: Provide education and appropriate resources to help participant work on and attain dietary goals.;Weight Management/Obesity: Establish reasonable short term and long term weight goals.;Obesity: Provide education and appropriate resources to help participant work on and attain dietary goals.   Admit Weight 271 lb 3.2 oz (123 kg)   Goal Weight: Short Term 266 lb (120.7 kg)   Goal Weight: Long Term 220 lb (99.8 kg)   Expected Outcomes Long Term: Adherence to nutrition and physical activity/exercise program aimed toward attainment of established weight goal;Short Term: Continue to assess and modify interventions until short term weight is achieved;Weight Maintenance: Understanding of the daily nutrition guidelines, which includes 25-35% calories from fat, 7% or less cal from saturated fats, less than '200mg'$  cholesterol, less than 1.5gm of sodium, & 5 or more servings of fruits and vegetables daily;Weight Loss: Understanding of general recommendations for a balanced deficit meal plan, which promotes 1-2 lb weight loss per week and includes a negative energy balance of [phone] kcal/d;Understanding recommendations for meals to include 15-35% energy as protein, 25-35% energy from fat, 35-60% energy from carbohydrates, less than '200mg'$  of dietary cholesterol, 20-35 gm of total fiber daily;Understanding of distribution of calorie intake throughout the day with the consumption of 4-5 meals/snacks   Improve shortness of breath with ADL's Yes   Intervention Provide education, individualized exercise plan  and daily activity instruction to help decrease symptoms of SOB with activities of daily living.   Expected Outcomes Short Term: Achieves a reduction of symptoms when performing activities of daily living.   Develop more efficient breathing techniques such as purse lipped breathing and diaphragmatic breathing; and practicing self-pacing with activity Yes   Intervention Provide education, demonstration and support about specific breathing techniuqes utilized for more efficient breathing. Include techniques such as pursed lipped breathing, diaphragmatic breathing and self-pacing activity.   Expected Outcomes Short Term: Participant will be able to demonstrate and use breathing techniques as needed throughout daily activities.   Increase knowledge of respiratory medications and ability to use respiratory devices properly  Yes  Anoro E and Albuterol MDI; spacer given   Intervention Provide education and demonstration as needed of appropriate use of medications, inhalers, and oxygen therapy.   Expected Outcomes Short Term: Achieves understanding of medications use. Understands that oxygen is a medication prescribed by physician. Demonstrates appropriate use of inhaler and oxygen therapy.   Heart Failure Yes   Intervention Provide a combined exercise and nutrition program that is supplemented with education, support and counseling about heart failure. Directed toward relieving symptoms such as shortness of breath, decreased exercise tolerance, and extremity edema.   Expected Outcomes Improve functional capacity of life;Short term: Attendance in program 2-3 days a week with increased exercise capacity. Reported lower sodium intake. Reported increased fruit and vegetable intake. Reports medication compliance.;Short term: Daily weights obtained and reported for increase. Utilizing diuretic protocols set by physician.;Long term: Adoption of self-care skills and reduction of barriers for early signs and symptoms  recognition and intervention leading to self-care maintenance.   Hypertension Yes   Intervention Provide education on lifestyle modifcations including regular physical activity/exercise, weight management, moderate sodium restriction and increased consumption of fresh fruit, vegetables, and low fat dairy, alcohol moderation, and smoking  cessation.;Monitor prescription use compliance.   Expected Outcomes Short Term: Continued assessment and intervention until BP is < 140/51m HG in hypertensive participants. < 130/84mHG in hypertensive participants with diabetes, heart failure or chronic kidney disease.;Long Term: Maintenance of blood pressure at goal levels.   Lipids Yes   Intervention Provide education and support for participant on nutrition & aerobic/resistive exercise along with prescribed medications to achieve LDL <7049mHDL >9m65m Expected Outcomes Short Term: Participant states understanding of desired cholesterol values and is compliant with medications prescribed. Participant is following exercise prescription and nutrition guidelines.;Long Term: Cholesterol controlled with medications as prescribed, with individualized exercise RX and with personalized nutrition plan. Value goals: LDL < 70mg9mL > 40 mg.      Core Components/Risk Factors/Patient Goals Review:      Goals and Risk Factor Review    Row Name 07/15/16 1419 08/05/16 1517 08/29/16 1219 09/18/16 1416 10/02/16 1245     Core Components/Risk Factors/Patient Goals Review   Personal Goals Review Weight Management/Obesity;Develop more efficient breathing techniques such as purse lipped breathing and diaphragmatic breathing and practicing self-pacing with activity.;Improve shortness of breath with ADL's Weight Management/Obesity;Improve shortness of breath with ADL's;Develop more efficient breathing techniques such as purse lipped breathing and diaphragmatic breathing and practicing self-pacing with activity. Weight  Management/Obesity;Improve shortness of breath with ADL's;Increase knowledge of respiratory medications and ability to use respiratory devices properly.;Lipids;Hypertension;Develop more efficient breathing techniques such as purse lipped breathing and diaphragmatic breathing and practicing self-pacing with activity. Weight Management/Obesity;Improve shortness of breath with ADL's;Lipids;Hypertension;Develop more efficient breathing techniques such as purse lipped breathing and diaphragmatic breathing and practicing self-pacing with activity.;Increase knowledge of respiratory medications and ability to use respiratory devices properly. Weight Management/Obesity;Improve shortness of breath with ADL's;Heart Failure;Other;Hypertension   Review Mr HendeBribiescacompleted 3 sessions of [facility]. He is cued to do PLB and does have shortness of breath with most activity. He would like to mow at home, but with the heat and shortness of breath, he has not been able to mow, but he hopes to in the future while [place] 273lbs, does not want to meet with the dietitian at this time, and states his wife cooks healthy.[facility] days. Mr. HendeKilpatrickbeen working hard the last few sessions pushing himself. He states some days are better than others with his shortness of breath but it has not been too bad. Bill needs to work on his PLB techniques when he gets short of breath. His exercise has been going well but feels like he needs to improve more. His blood pressure has been stable. He is using his respiratory medications as prescribed with a spacer. His weight has not changed much since the start of the program and sometimes states he eats too much. Bill Rush Landmarkbeen working on losing weight and he has lost about 5 pounds recently. He states he has been eating less and working with his wife to keep his weight off.  He has been walking more laps in the gym.  Bill states his weight was up today due to eating at a buffet while out of town.  He did not have any shortness of breath.  Other than eating out, Bill Rush Landmarkrts he doesnt use salt at all. His BP has been good in class.    Expected Outcomes Long term: Continue to attend [facility] to increase his stamina and conditioning  to improve his home exercise; Short: Learn to use PLB without cueing. Long term: Continue to attend [facility] to increase his strength to improve his home exercise routine at the [facility]; Short: Learn to use PLB without cueing. Lose weight for easier breathing. Short: Work on PLB techniques. Lose more weight. Increase exercise routine. Long: use PLB without cueing. Increase his SOB and his ADL Short: Continue to lose weight and work on PLB techniques. Long: maintain weight loss and use PLB without cueing Short - Rush Landmark will get back to original weight by returning to usual habits.  Long - Rush Landmark will maintain healthy lifestyle choices.   [place] Name 11/06/16 1449             Core Components/Risk Factors/Patient Goals Review   Personal Goals Review Weight Management/Obesity;Improve shortness of breath with ADL's;Hypertension       Review Rush Landmark has lost some weight this past month by eating better portions. His blood pressure has been under control and has been at adequate levels. Bill feels like he has only improved slighty throughout the program.       Expected Outcomes Short: Improve SOB with ADL's. Long: Adhere to an exercise plane to improve ADL.          Core Components/Risk Factors/Patient Goals at Discharge (Final Review):      Goals and Risk Factor Review - 11/06/16 1449      Core Components/Risk Factors/Patient Goals Review   Personal Goals Review Weight Management/Obesity;Improve shortness of breath with ADL's;Hypertension   Review Rush Landmark has lost some weight this past month by eating better portions. His blood pressure has been  under control and has been at adequate levels. Bill feels like he has only improved slighty throughout the program.   Expected Outcomes Short: Improve SOB with ADL's. Long: Adhere to an exercise plane to improve ADL.      ITP Comments:     ITP Comments    Row Name 07/10/16 1306 07/22/16 0741 08/02/16 1345 08/19/16 0851 09/16/16 0816   ITP Comments Bill has been absent due to other Dr appointments. 30 day note review with Medical Director of [place] Dr. Emily Filbert  Reviewed home exercise with pt today.  Pt plans to walk  for exercise.  Reviewed THR, pulse, RPE, sign and symptoms, NTG use, and when to call 911 or MD.  Also discussed weather considerations and indoor options.  Pt voiced understanding. 30 day review completed ITP sent to Dr. Ramonita Lab for Dr. Emily Filbert Director of [place]. Continue with ITP unless changes are made by physician.  30 day review completed. ITP sent to Dr. Emily Filbert Director of [place]. Continue with ITP unless changes are made by physician.     Row Name 10/11/16 1019 10/14/16 0822 10/16/16 1011 11/01/16 1023 11/11/16 0817   ITP Comments He will be out Monday and Friday next week 30 day review completed. ITP sent to Dr. Emily Filbert Director of [place]. Continue with ITP unless changes are made by physician.   Rush Landmark will be out all next week due to doctor appointments. Rush Landmark states he will be out Monday. 30 day review completed. ITP sent to Dr. Emily Filbert Director of [place]. Continue with ITP unless changes are made by physician.     [place] Name 11/13/16 0955           ITP Comments Discharge ITP sent and signed by Dr. Sabra Heck.  Discharge Summary routed to PCP and Pulmonologist.  Comments: Discharge ITP

## 2016-11-13 NOTE — Progress Notes (Signed)
Daily Session Note  Patient Details  Name: Bradley Barnes. MRN: [id number] Date of Birth: May 11, 1940 Referring Provider:     Pulmonary Rehab from 06/25/2016 in [facility]  Referring Provider  Ramonita Lab MD      Encounter Date: 11/13/2016  Check In:     Session Check In - 11/13/16 0953      Check-In   Location [facility]-Cardiac & Pulmonary Rehab   Staff Present Alberteen Sam, MA, ACSM RCEP, Exercise Physiologist;Amanda Oletta Darter, BA, ACSM CEP, Exercise Physiologist;Jadarious Dobbins Flavia Shipper   Supervising physician immediately available to respond to emergencies [facility] immediately available ER MD   Physician(s) Dr. Jimmye Norman and Clearnce Hasten   Medication changes reported     No   Fall or balance concerns reported    No   Warm-up and Cool-down Performed on first and last piece of equipment   Resistance Training Performed Yes   VAD Patient? No     Pain Assessment   Currently in Pain? No/denies         History  Smoking Status  . Former Smoker  . Packs/day: 1.00  . Years: 33.00  . Types: Cigarettes  . Quit date: 10/03/1991  Smokeless Tobacco  . Never Used    Goals Met:  Proper associated with RPD/PD & O2 Sat [place] with exercise equipment Exercise tolerated well No report of cardiac concerns or symptoms Strength training completed today  Goals Unmet:  Not Applicable  Comments:  Terrel graduated today from cardiac rehab with 36 sessions completed.  Details of the patient's exercise prescription and what He needs to do in order to continue the prescription and progress were discussed with patient.  Patient was given a copy of prescription and goals.  Patient verbalized understanding.  Zong plans to continue to exercise by walking at the [employer] [facility] [employer].   Dr. Emily Filbert is Medical Director for [place] and [facility].

## 2016-11-13 NOTE — Progress Notes (Signed)
Discharge Progress Report  Patient Details  Name: Bradley Barnes. MRN: [id number] Date of Birth: [birth year] Referring Provider:     Pulmonary Rehab from 06/25/2016 in [facility]  Referring Provider  Ramonita Lab MD       Number of Visits: 36/36  Reason for Discharge:  Patient reached a stable level of exercise. Patient independent in their exercise. Patient has met program and personal goals.  Smoking History:  History  Smoking Status  . Former Smoker  . Packs/day: 1.00  . Years: 33.00  . Types: Cigarettes  . Quit date: 10/03/1991  Smokeless Tobacco  . Never Used    Diagnosis:  Chronic obstructive pulmonary disease, unspecified COPD type ([place])  ADL [facility]:     Pulmonary Assessment Scores    Row Name 06/25/16 1519 08/12/16 1354 11/06/16 1018     ADL [facility]   ADL Phase Entry Mid Exit   SOB Score total 52 47 46   Rest 0 1 0   Walk '3 2 1   ' Stairs '4 5 4   ' Bath '2 1 1   ' Dress 2 0 1   Shop '4 4 3     ' mMRC Score   mMRC Score 2  - 2      Initial Exercise Prescription:     Initial Exercise Prescription - 06/25/16 1400      Date of Initial Exercise RX and Referring Provider   Date 06/25/16   Referring Provider Ramonita Lab MD     Treadmill   MPH 1.1   Grade 0   Minutes 15   METs 1.84     Bike   Level 0.5   Watts 10   Minutes 15   METs 1.5     T5 Nustep   Level 1   SPM 80   Minutes 15   METs 1.5     Track   Laps 15   Minutes 15   METs 1.7     Prescription Details   Frequency (times per week) 3   Duration Progress to 45 minutes of aerobic exercise without signs/symptoms of physical distress     Intensity   THRR 40-80% of Max Heartrate 91-127   Ratings of Perceived Exertion 11-13   Perceived Dyspnea 0-4     Progression   Progression Continue to progress workloads to maintain intensity without signs/symptoms of physical distress.     Resistance Training   Training Prescription Yes   Weight 3 lbs   Reps 10-15       Discharge Exercise Prescription (Final Exercise Prescription Changes):     Exercise Prescription Changes - 10/30/16 1300      Response to Exercise   Blood Pressure (Admit) 130/20   Blood Pressure (Exit) 128/66   Heart Rate (Admit) 54 bpm   Heart Rate (Exercise) 61 bpm   Heart Rate (Exit) 54 bpm   Oxygen Saturation (Admit) 98 %   Oxygen Saturation (Exercise) 99 %   Oxygen Saturation (Exit) 99 %   Rating of Perceived Exertion (Exercise) 14   Perceived Dyspnea (Exercise) 3   Duration Continue with 45 min of aerobic exercise without signs/symptoms of physical distress.   Intensity Other (comment)  PM keeps HR low     Progression   Progression Continue to progress workloads to maintain intensity without signs/symptoms of physical distress.   Average METs 2.4     Resistance Training   Training Prescription Yes   Weight 4 lb   Reps  10-15     Interval Training   Interval Training No     Bike   Watts 58   Minutes 15   METs 3.5     T5 Nustep   Level 3   SPM 59   Minutes 15   METs 2     Track   Laps 23   Minutes 15   METs 1.92      Functional Capacity:     6 Minute Walk    Row Name 06/25/16 1421 11/06/16 1040       6 Minute Walk   Phase Initial Discharge    Distance 730 feet 925 feet    Distance % Change  - 27 %    Distance Feet Change  - 195 ft    Walk Time 6 minutes 6 minutes    # of Rest Breaks 0 0    MPH 1.38 1.75    METS 0 1.36    RPE 16 14    Perceived Dyspnea  3 3    VO2 Peak 3.34 4.76    Symptoms Yes (comment) Yes (comment)    Comments short of breath, using rolling walker SHORT OF BREATH, USING WALKER    Resting HR 57 bpm 57 bpm    Resting BP 134/64  -    Resting Oxygen Saturation   - 98 %    Exercise Oxygen Saturation  during 6 min walk  - 97 %    Max Ex. HR 72 bpm 71 bpm    Max Ex. BP 144/74 146/72    2 Minute Post BP 126/70  -      Interval HR   Baseline HR (retired) 57  -    1 Minute HR  - 63    2 Minute HR  - 68    3 Minute  HR  - 70    4 Minute HR 72  -    5 Minute HR 72 71    6 Minute HR 72 70    2 Minute Post HR 56  -    Interval Heart Rate? Yes Yes      Interval Oxygen   Interval Oxygen? Yes  -    Baseline Oxygen Saturation % 98 % 98 %    Resting Liters of Oxygen 0 L  Room Air  -    1 Minute Oxygen Saturation %  - 99 %    1 Minute Liters of Oxygen 0 L 0 L    2 Minute Oxygen Saturation %  - 98 %    2 Minute Liters of Oxygen 0 L 0 L    3 Minute Oxygen Saturation % 90 % 97 %    3 Minute Liters of Oxygen 0 L 0 L    4 Minute Oxygen Saturation % 95 %  -    4 Minute Liters of Oxygen 0 L 0 L    5 Minute Oxygen Saturation % 99 % 98 %    5 Minute Liters of Oxygen 0 L 0 L    6 Minute Oxygen Saturation % 99 % 98 %    6 Minute Liters of Oxygen 0 L 0 L    2 Minute Post Oxygen Saturation % 100 % 98 %    2 Minute Post Liters of Oxygen 0 L 0 L       Psychological, QOL, Others - Outcomes: PHQ 2/9: Depression screen [facility] 2/9 11/06/2016 06/25/2016 09/26/2015 08/07/2015  Decreased Interest 0 1 1  1  Down, Depressed, Hopeless 0 0 0 0  PHQ - 2 Score 0 '1 1 1  ' Altered sleeping '1 1 2 1  ' Tired, decreased energy '1 2 1 1  ' Change in appetite 0 1 2 0  Feeling bad or failure about yourself  0 0 0 0  Trouble concentrating '1 1 1 1  ' Moving slowly or fidgety/restless 0 0 1 0  Suicidal thoughts 0 0 0 0  PHQ-9 Score '3 6 8 4  ' Difficult doing work/chores Somewhat difficult Very difficult Somewhat difficult Somewhat difficult    Quality of Life:     Quality of Life - 11/06/16 1024      Quality of Life Scores   Health/Function Pre 20.06 %   Health/Function Post 18.56 %   Health/Function % Change -7.48 %   Socioeconomic Pre 19.83 %   Socioeconomic Post 24.21 %   Socioeconomic % Change  22.09 %   Psych/Spiritual Pre 20.33 %   Psych/Spiritual Post 23.71 %   Psych/Spiritual % Change 16.63 %   Family Pre 21 %   Family Post 25.2 %   Family % Change 20 %   GLOBAL Pre 20.19 %   GLOBAL Post 21.67 %   GLOBAL % Change 7.33 %       Personal Goals: Goals established at orientation with interventions provided to work toward goal.     Personal Goals and Risk Factors at Admission - 06/25/16 1522      Core Components/Risk Factors/Patient Goals on Admission    Weight Management Yes;Weight Loss   Intervention Weight Management: Develop a combined nutrition and exercise program designed to reach desired caloric intake, while maintaining appropriate intake of nutrient and fiber, sodium and fats, and appropriate energy expenditure required for the weight goal.;Weight Management: Provide education and appropriate resources to help participant work on and attain dietary goals.;Weight Management/Obesity: Establish reasonable short term and long term weight goals.;Obesity: Provide education and appropriate resources to help participant work on and attain dietary goals.   Admit Weight 271 lb 3.2 oz (123 kg)   Goal Weight: Short Term 266 lb (120.7 kg)   Goal Weight: Long Term 220 lb (99.8 kg)   Expected Outcomes Long Term: Adherence to nutrition and physical activity/exercise program aimed toward attainment of established weight goal;Short Term: Continue to assess and modify interventions until short term weight is achieved;Weight Maintenance: Understanding of the daily nutrition guidelines, which includes 25-35% calories from fat, 7% or less cal from saturated fats, less than 210m cholesterol, less than 1.5gm of sodium, & 5 or more servings of fruits and vegetables daily;Weight Loss: Understanding of general recommendations for a balanced deficit meal plan, which promotes 1-2 lb weight loss per week and includes a negative energy balance of [phone] kcal/d;Understanding recommendations for meals to include 15-35% energy as protein, 25-35% energy from fat, 35-60% energy from carbohydrates, less than 2037mof dietary cholesterol, 20-35 gm of total fiber daily;Understanding of distribution of calorie intake throughout the day with the  consumption of 4-5 meals/snacks   Improve shortness of breath with ADL's Yes   Intervention Provide education, individualized exercise plan and daily activity instruction to help decrease symptoms of SOB with activities of daily living.   Expected Outcomes Short Term: Achieves a reduction of symptoms when performing activities of daily living.   Develop more efficient breathing techniques such as purse lipped breathing and diaphragmatic breathing; and practicing self-pacing with activity Yes   Intervention Provide education, demonstration and support about specific breathing techniuqes  utilized for more efficient breathing. Include techniques such as pursed lipped breathing, diaphragmatic breathing and self-pacing activity.   Expected Outcomes Short Term: Participant will be able to demonstrate and use breathing techniques as needed throughout daily activities.   Increase knowledge of respiratory medications and ability to use respiratory devices properly  Yes  Anoro E and Albuterol MDI; spacer given   Intervention Provide education and demonstration as needed of appropriate use of medications, inhalers, and oxygen therapy.   Expected Outcomes Short Term: Achieves understanding of medications use. Understands that oxygen is a medication prescribed by physician. Demonstrates appropriate use of inhaler and oxygen therapy.   Heart Failure Yes   Intervention Provide a combined exercise and nutrition program that is supplemented with education, support and counseling about heart failure. Directed toward relieving symptoms such as shortness of breath, decreased exercise tolerance, and extremity edema.   Expected Outcomes Improve functional capacity of life;Short term: Attendance in program 2-3 days a week with increased exercise capacity. Reported lower sodium intake. Reported increased fruit and vegetable intake. Reports medication compliance.;Short term: Daily weights obtained and reported for increase.  Utilizing diuretic protocols set by physician.;Long term: Adoption of self-care skills and reduction of barriers for early signs and symptoms recognition and intervention leading to self-care maintenance.   Hypertension Yes   Intervention Provide education on lifestyle modifcations including regular physical activity/exercise, weight management, moderate sodium restriction and increased consumption of fresh fruit, vegetables, and low fat dairy, alcohol moderation, and smoking cessation.;Monitor prescription use compliance.   Expected Outcomes Short Term: Continued assessment and intervention until BP is < 140/12m HG in hypertensive participants. < 130/87mHG in hypertensive participants with diabetes, heart failure or chronic kidney disease.;Long Term: Maintenance of blood pressure at goal levels.   Lipids Yes   Intervention Provide education and support for participant on nutrition & aerobic/resistive exercise along with prescribed medications to achieve LDL <7081mHDL >40m69m Expected Outcomes Short Term: Participant states understanding of desired cholesterol values and is compliant with medications prescribed. Participant is following exercise prescription and nutrition guidelines.;Long Term: Cholesterol controlled with medications as prescribed, with individualized exercise RX and with personalized nutrition plan. Value goals: LDL < 70mg38mL > 40 mg.       Personal Goals Discharge:     Goals and Risk Factor Review    Row Name 07/15/16 1419 08/05/16 1517 08/29/16 1219 09/18/16 1416 10/02/16 1245     Core Components/Risk Factors/Patient Goals Review   Personal Goals Review Weight Management/Obesity;Develop more efficient breathing techniques such as purse lipped breathing and diaphragmatic breathing and practicing self-pacing with activity.;Improve shortness of breath with ADL's Weight Management/Obesity;Improve shortness of breath with ADL's;Develop more efficient breathing techniques such  as purse lipped breathing and diaphragmatic breathing and practicing self-pacing with activity. Weight Management/Obesity;Improve shortness of breath with ADL's;Increase knowledge of respiratory medications and ability to use respiratory devices properly.;Lipids;Hypertension;Develop more efficient breathing techniques such as purse lipped breathing and diaphragmatic breathing and practicing self-pacing with activity. Weight Management/Obesity;Improve shortness of breath with ADL's;Lipids;Hypertension;Develop more efficient breathing techniques such as purse lipped breathing and diaphragmatic breathing and practicing self-pacing with activity.;Increase knowledge of respiratory medications and ability to use respiratory devices properly. Weight Management/Obesity;Improve shortness of breath with ADL's;Heart Failure;Other;Hypertension   Review Mr HendeQuaintancecompleted 3 sessions of [facility]. He is cued to do PLB and does have shortness of breath with most activity. He would like to mow at home, but with the heat and shortness of breath, he has not been able  to mow, but he hopes to in the future while improving in [place]. He is maintaining his weight at 273lbs, does not want to meet with the dietitian at this time, and states his wife cooks healthy.Mr Hauschild does walk at the [facility] 3-4 days/week, even on [facility] days. Mr. Villarruel has been working hard the last few sessions pushing himself. He states some days are better than others with his shortness of breath but it has not been too bad. Bill needs to work on his PLB techniques when he gets short of breath. His exercise has been going well but feels like he needs to improve more. His blood pressure has been stable. He is using his respiratory medications as prescribed with a spacer. His weight has not changed much since the start of the program and sometimes states he eats too much. Rush Landmark has been working on losing weight and he has lost about 5  pounds recently. He states he has been eating less and working with his wife to keep his weight off. He has been walking more laps in the gym.  Bill states his weight was up today due to eating at a buffet while out of town.  He did not have any shortness of breath.  Other than eating out, Rush Landmark reports he doesnt use salt at all. His BP has been good in class.    Expected Outcomes Long term: Continue to attend [facility] to increase his stamina and conditioning to improve his home exercise; Short: Learn to use PLB without cueing. Long term: Continue to attend [facility] to increase his strength to improve his home exercise routine at the [facility]; Short: Learn to use PLB without cueing. Lose weight for easier breathing. Short: Work on PLB techniques. Lose more weight. Increase exercise routine. Long: use PLB without cueing. Increase his SOB and his ADL Short: Continue to lose weight and work on PLB techniques. Long: maintain weight loss and use PLB without cueing Short - Rush Landmark will get back to original weight by returning to usual habits.  Long - Rush Landmark will maintain healthy lifestyle choices.   [place] Name 11/06/16 1449             Core Components/Risk Factors/Patient Goals Review   Personal Goals Review Weight Management/Obesity;Improve shortness of breath with ADL's;Hypertension       Review Rush Landmark has lost some weight this past month by eating better portions. His blood pressure has been under control and has been at adequate levels. Bill feels like he has only improved slighty throughout the program.       Expected Outcomes Short: Improve SOB with ADL's. Long: Adhere to an exercise plane to improve ADL.          Exercise Goals and Review:     Exercise Goals    Row Name 06/25/16 1428             Exercise Goals   Increase Physical Activity Yes       Intervention Provide advice, education, support and counseling about physical activity/exercise needs.;Develop an individualized exercise  prescription for aerobic and resistive training based on initial evaluation findings, risk stratification, comorbidities and participant's personal goals.       Expected Outcomes Achievement of increased cardiorespiratory fitness and enhanced flexibility, muscular endurance and strength shown through measurements of functional capacity and personal statement of participant.       Increase Strength and Stamina Yes       Intervention Provide advice, education, support  and counseling about physical activity/exercise needs.;Develop an individualized exercise prescription for aerobic and resistive training based on initial evaluation findings, risk stratification, comorbidities and participant's personal goals.       Expected Outcomes Achievement of increased cardiorespiratory fitness and enhanced flexibility, muscular endurance and strength shown through measurements of functional capacity and personal statement of participant.          Nutrition & Weight - Outcomes:     Pre Biometrics - 06/25/16 1428      Pre Biometrics   Height '6\' 2"'  (1.88 m)   Weight 271 lb 3.2 oz (123 kg)   Waist Circumference 52 inches   Hip Circumference 44 inches   Waist to Hip Ratio 1.18 %   BMI (Calculated) 34.9       Nutrition:     Nutrition Therapy & Goals - 08/05/16 1527      Nutrition Therapy   RD appointment defered Yes     Personal Nutrition Goals   Comments patient states his Wife is a nutritionist     Intervention Plan   Intervention Nutrition handout(s) given to patient.   Expected Outcomes Long Term Goal: Adherence to prescribed nutrition plan.;Short Term Goal: Understand basic principles of dietary content, such as calories, fat, sodium, cholesterol and nutrients.      Nutrition Discharge:     Nutrition Assessments - 11/06/16 1020      MEDFICTS Scores   Pre Score 3      Education Questionnaire Score:     Knowledge Questionnaire Score - 11/06/16 1020      Knowledge Questionnaire  Score   Pre Score 10/10   Post Score 10/10      Goals reviewed with patient; copy given to patient.

## 2016-11-13 NOTE — Patient Instructions (Signed)
Discharge Instructions  Patient Details  Name: Bradley Barnes. MRN: [id number] Date of Birth: 1940-03-01 Referring Provider:  Adin Hector, MD   Number of Visits: 36  Reason for Discharge:  Patient reached a stable level of exercise. Patient independent in their exercise. Patient has met program and personal goals.  Smoking History:  History  Smoking Status  . Former Smoker  . Packs/day: 1.00  . Years: 33.00  . Types: Cigarettes  . Quit date: 10/03/1991  Smokeless Tobacco  . Never Used    Diagnosis:  Chronic obstructive pulmonary disease, unspecified COPD type [facility])  Initial Exercise Prescription:     Initial Exercise Prescription - 06/25/16 1400      Date of Initial Exercise RX and Referring Provider   Date 06/25/16   Referring Provider Ramonita Lab MD     Treadmill   MPH 1.1   Grade 0   Minutes 15   METs 1.84     Bike   Level 0.5   Watts 10   Minutes 15   METs 1.5     T5 Nustep   Level 1   SPM 80   Minutes 15   METs 1.5     Track   Laps 15   Minutes 15   METs 1.7     Prescription Details   Frequency (times per week) 3   Duration Progress to 45 minutes of aerobic exercise without signs/symptoms of physical distress     Intensity   THRR 40-80% of Max Heartrate 91-127   Ratings of Perceived Exertion 11-13   Perceived Dyspnea 0-4     Progression   Progression Continue to progress workloads to maintain intensity without signs/symptoms of physical distress.     Resistance Training   Training Prescription Yes   Weight 3 lbs   Reps 10-15      Discharge Exercise Prescription (Final Exercise Prescription Changes):     Exercise Prescription Changes - 10/30/16 1300      Response to Exercise   Blood Pressure (Admit) 130/20   Blood Pressure (Exit) 128/66   Heart Rate (Admit) 54 bpm   Heart Rate (Exercise) 61 bpm   Heart Rate (Exit) 54 bpm   Oxygen Saturation (Admit) 98 %   Oxygen Saturation (Exercise) 99 %   Oxygen Saturation  (Exit) 99 %   Rating of Perceived Exertion (Exercise) 14   Perceived Dyspnea (Exercise) 3   Duration Continue with 45 min of aerobic exercise without signs/symptoms of physical distress.   Intensity Other (comment)  PM keeps HR low     Progression   Progression Continue to progress workloads to maintain intensity without signs/symptoms of physical distress.   Average METs 2.4     Resistance Training   Training Prescription Yes   Weight 4 lb   Reps 10-15     Interval Training   Interval Training No     Bike   Watts 58   Minutes 15   METs 3.5     T5 Nustep   Level 3   SPM 59   Minutes 15   METs 2     Track   Laps 23   Minutes 15   METs 1.92      Functional Capacity:     6 Minute Walk    Row Name 06/25/16 1421 11/06/16 1040       6 Minute Walk   Phase Initial Discharge    Distance 730 feet 925 feet  Distance % Change  - 27 %    Distance Feet Change  - 195 ft    Walk Time 6 minutes 6 minutes    # of Rest Breaks 0 0    MPH 1.38 1.75    METS 0 1.36    RPE 16 14    Perceived Dyspnea  3 3    VO2 Peak 3.34 4.76    Symptoms Yes (comment) Yes (comment)    Comments short of breath, using rolling walker SHORT OF BREATH, USING WALKER    Resting HR 57 bpm 57 bpm    Resting BP 134/64  -    Resting Oxygen Saturation   - 98 %    Exercise Oxygen Saturation  during 6 min walk  - 97 %    Max Ex. HR 72 bpm 71 bpm    Max Ex. BP 144/74 146/72    2 Minute Post BP 126/70  -      Interval HR   Baseline HR (retired) 57  -    1 Minute HR  - 63    2 Minute HR  - 68    3 Minute HR  - 70    4 Minute HR 72  -    5 Minute HR 72 71    6 Minute HR 72 70    2 Minute Post HR 56  -    Interval Heart Rate? Yes Yes      Interval Oxygen   Interval Oxygen? Yes  -    Baseline Oxygen Saturation % 98 % 98 %    Resting Liters of Oxygen 0 L  Room Air  -    1 Minute Oxygen Saturation %  - 99 %    1 Minute Liters of Oxygen 0 L 0 L    2 Minute Oxygen Saturation %  - 98 %    2  Minute Liters of Oxygen 0 L 0 L    3 Minute Oxygen Saturation % 90 % 97 %    3 Minute Liters of Oxygen 0 L 0 L    4 Minute Oxygen Saturation % 95 %  -    4 Minute Liters of Oxygen 0 L 0 L    5 Minute Oxygen Saturation % 99 % 98 %    5 Minute Liters of Oxygen 0 L 0 L    6 Minute Oxygen Saturation % 99 % 98 %    6 Minute Liters of Oxygen 0 L 0 L    2 Minute Post Oxygen Saturation % 100 % 98 %    2 Minute Post Liters of Oxygen 0 L 0 L       Quality of Life:     Quality of Life - 11/06/16 1024      Quality of Life Scores   Health/Function Pre 20.06 %   Health/Function Post 18.56 %   Health/Function % Change -7.48 %   Socioeconomic Pre 19.83 %   Socioeconomic Post 24.21 %   Socioeconomic % Change  22.09 %   Psych/Spiritual Pre 20.33 %   Psych/Spiritual Post 23.71 %   Psych/Spiritual % Change 16.63 %   Family Pre 21 %   Family Post 25.2 %   Family % Change 20 %   GLOBAL Pre 20.19 %   GLOBAL Post 21.67 %   GLOBAL % Change 7.33 %      Personal Goals: Goals established at orientation with interventions provided to work toward goal.  Personal Goals and Risk Factors at Admission - 06/25/16 1522      Core Components/Risk Factors/Patient Goals on Admission    Weight Management Yes;Weight Loss   Intervention Weight Management: Develop a combined nutrition and exercise program designed to reach desired caloric intake, while maintaining appropriate intake of nutrient and fiber, sodium and fats, and appropriate energy expenditure required for the weight goal.;Weight Management: Provide education and appropriate resources to help participant work on and attain dietary goals.;Weight Management/Obesity: Establish reasonable short term and long term weight goals.;Obesity: Provide education and appropriate resources to help participant work on and attain dietary goals.   Admit Weight 271 lb 3.2 oz (123 kg)   Goal Weight: Short Term 266 lb (120.7 kg)   Goal Weight: Long Term 220 lb (99.8  kg)   Expected Outcomes Long Term: Adherence to nutrition and physical activity/exercise program aimed toward attainment of established weight goal;Short Term: Continue to assess and modify interventions until short term weight is achieved;Weight Maintenance: Understanding of the daily nutrition guidelines, which includes 25-35% calories from fat, 7% or less cal from saturated fats, less than 258m cholesterol, less than 1.5gm of sodium, & 5 or more servings of fruits and vegetables daily;Weight Loss: Understanding of general recommendations for a balanced deficit meal plan, which promotes 1-2 lb weight loss per week and includes a negative energy balance of [phone] kcal/d;Understanding recommendations for meals to include 15-35% energy as protein, 25-35% energy from fat, 35-60% energy from carbohydrates, less than 2079mof dietary cholesterol, 20-35 gm of total fiber daily;Understanding of distribution of calorie intake throughout the day with the consumption of 4-5 meals/snacks   Improve shortness of breath with ADL's Yes   Intervention Provide education, individualized exercise plan and daily activity instruction to help decrease symptoms of SOB with activities of daily living.   Expected Outcomes Short Term: Achieves a reduction of symptoms when performing activities of daily living.   Develop more efficient breathing techniques such as purse lipped breathing and diaphragmatic breathing; and practicing self-pacing with activity Yes   Intervention Provide education, demonstration and support about specific breathing techniuqes utilized for more efficient breathing. Include techniques such as pursed lipped breathing, diaphragmatic breathing and self-pacing activity.   Expected Outcomes Short Term: Participant will be able to demonstrate and use breathing techniques as needed throughout daily activities.   Increase knowledge of respiratory medications and ability to use respiratory devices properly  Yes   Anoro E and Albuterol MDI; spacer given   Intervention Provide education and demonstration as needed of appropriate use of medications, inhalers, and oxygen therapy.   Expected Outcomes Short Term: Achieves understanding of medications use. Understands that oxygen is a medication prescribed by physician. Demonstrates appropriate use of inhaler and oxygen therapy.   Heart Failure Yes   Intervention Provide a combined exercise and nutrition program that is supplemented with education, support and counseling about heart failure. Directed toward relieving symptoms such as shortness of breath, decreased exercise tolerance, and extremity edema.   Expected Outcomes Improve functional capacity of life;Short term: Attendance in program 2-3 days a week with increased exercise capacity. Reported lower sodium intake. Reported increased fruit and vegetable intake. Reports medication compliance.;Short term: Daily weights obtained and reported for increase. Utilizing diuretic protocols set by physician.;Long term: Adoption of self-care skills and reduction of barriers for early signs and symptoms recognition and intervention leading to self-care maintenance.   Hypertension Yes   Intervention Provide education on lifestyle modifcations including regular physical activity/exercise, weight management, moderate sodium  restriction and increased consumption of fresh fruit, vegetables, and low fat dairy, alcohol moderation, and smoking cessation.;Monitor prescription use compliance.   Expected Outcomes Short Term: Continued assessment and intervention until BP is < 140/57m HG in hypertensive participants. < 130/82mHG in hypertensive participants with diabetes, heart failure or chronic kidney disease.;Long Term: Maintenance of blood pressure at goal levels.   Lipids Yes   Intervention Provide education and support for participant on nutrition & aerobic/resistive exercise along with prescribed medications to achieve LDL  <7018mHDL >3m29m Expected Outcomes Short Term: Participant states understanding of desired cholesterol values and is compliant with medications prescribed. Participant is following exercise prescription and nutrition guidelines.;Long Term: Cholesterol controlled with medications as prescribed, with individualized exercise RX and with personalized nutrition plan. Value goals: LDL < 70mg31mL > 40 mg.       Personal Goals Discharge:     Goals and Risk Factor Review - 11/06/16 1449      Core Components/Risk Factors/Patient Goals Review   Personal Goals Review Weight Management/Obesity;Improve shortness of breath with ADL's;Hypertension   Review Bradley Rush Landmarklost some weight this past month by eating better portions. His blood pressure has been under control and has been at adequate levels. Bradley feels like he has only improved slighty throughout the program.   Expected Outcomes Short: Improve SOB with ADL's. Long: Adhere to an exercise plane to improve ADL.      Exercise Goals and Review:     Exercise Goals    Row Name 06/25/16 1428             Exercise Goals   Increase Physical Activity Yes       Intervention Provide advice, education, support and counseling about physical activity/exercise needs.;Develop an individualized exercise prescription for aerobic and resistive training based on initial evaluation findings, risk stratification, comorbidities and participant's personal goals.       Expected Outcomes Achievement of increased cardiorespiratory fitness and enhanced flexibility, muscular endurance and strength shown through measurements of functional capacity and personal statement of participant.       Increase Strength and Stamina Yes       Intervention Provide advice, education, support and counseling about physical activity/exercise needs.;Develop an individualized exercise prescription for aerobic and resistive training based on initial evaluation findings, risk stratification,  comorbidities and participant's personal goals.       Expected Outcomes Achievement of increased cardiorespiratory fitness and enhanced flexibility, muscular endurance and strength shown through measurements of functional capacity and personal statement of participant.          Nutrition & Weight - Outcomes:     Pre Biometrics - 06/25/16 1428      Pre Biometrics   Height _0  (1.88 m)   Weight 271 lb 3.2 oz (123 kg)   Waist Circumference 52 inches   Hip Circumference 44 inches   Waist to Hip Ratio 1.18 %   BMI (Calculated) 34.9       Nutrition:     Nutrition Therapy & Goals - 08/05/16 1527      Nutrition Therapy   RD appointment defered Yes     Personal Nutrition Goals   Comments patient states his Wife is a nutritionist     Intervention Plan   Intervention Nutrition handout(s) given to patient.   Expected Outcomes Long Term Goal: Adherence to prescribed nutrition plan.;Short Term Goal: Understand basic principles of dietary content, such as calories, fat, sodium, cholesterol and nutrients.  Nutrition Discharge:     Nutrition Assessments - 11/06/16 1020      MEDFICTS Scores   Pre Score 3      Education Questionnaire Score:     Knowledge Questionnaire Score - 11/06/16 1020      Knowledge Questionnaire Score   Pre Score 10/10   Post Score 10/10      Goals reviewed with patient; copy given to patient.

## 2019-03-23 ENCOUNTER — Other Ambulatory Visit: Payer: Self-pay | Admitting: Internal Medicine

## 2019-03-23 DIAGNOSIS — M79605 Pain in left leg: Secondary | ICD-10-CM

## 2019-03-23 DIAGNOSIS — R600 Localized edema: Secondary | ICD-10-CM

## 2019-03-25 ENCOUNTER — Other Ambulatory Visit: Payer: Self-pay

## 2019-03-25 ENCOUNTER — Ambulatory Visit
Admission: RE | Admit: 2019-03-25 | Discharge: 2019-03-25 | Disposition: A | Discharge status: Home / Self Care | Payer: Medicare HMO | Source: Ambulatory Visit | Attending: Internal Medicine | Admitting: Internal Medicine

## 2019-03-25 ENCOUNTER — Other Ambulatory Visit: Payer: Self-pay | Admitting: Internal Medicine

## 2019-03-25 DIAGNOSIS — M79605 Pain in left leg: Secondary | ICD-10-CM

## 2019-03-25 DIAGNOSIS — R600 Localized edema: Secondary | ICD-10-CM | POA: Diagnosis present

## 2019-03-29 ENCOUNTER — Ambulatory Visit
Admission: RE | Admit: 2019-03-29 | Discharge: 2019-03-29 | Disposition: A | Discharge status: Home / Self Care | Payer: Medicare HMO | Source: Ambulatory Visit | Attending: Internal Medicine | Admitting: Internal Medicine

## 2019-03-29 ENCOUNTER — Other Ambulatory Visit: Payer: Self-pay

## 2019-03-29 ENCOUNTER — Ambulatory Visit: Admission: RE | Admit: 2019-03-29 | Payer: Medicare HMO | Source: Ambulatory Visit

## 2019-03-29 DIAGNOSIS — M79605 Pain in left leg: Secondary | ICD-10-CM | POA: Insufficient documentation

## 2021-01-09 ENCOUNTER — Other Ambulatory Visit: Payer: Self-pay

## 2021-01-09 ENCOUNTER — Encounter: Payer: Medicare HMO | Attending: Internal Medicine | Admitting: *Deleted

## 2021-01-09 ENCOUNTER — Encounter: Payer: Self-pay | Admitting: *Deleted

## 2021-01-09 DIAGNOSIS — J449 Chronic obstructive pulmonary disease, unspecified: Secondary | ICD-10-CM

## 2021-01-09 DIAGNOSIS — R0600 Dyspnea, unspecified: Secondary | ICD-10-CM

## 2021-01-09 NOTE — Progress Notes (Signed)
Virtual orientation call completed today. he has an appointment on Date: 02/05/2021  for EP eval and gym Orientation.  Documentation of diagnosis can be found in [facility] Date: 12/04/2020 .

## 2021-01-23 ENCOUNTER — Other Ambulatory Visit
Admission: RE | Admit: 2021-01-23 | Discharge: 2021-01-23 | Disposition: A | Discharge status: Home / Self Care | Payer: Medicare HMO | Source: Ambulatory Visit | Attending: Internal Medicine | Admitting: Internal Medicine

## 2021-01-23 ENCOUNTER — Other Ambulatory Visit: Payer: Self-pay

## 2021-01-23 DIAGNOSIS — Z01812 Encounter for preprocedural laboratory examination: Secondary | ICD-10-CM | POA: Diagnosis present

## 2021-01-23 DIAGNOSIS — Z20822 Contact with and (suspected) exposure to covid-19: Secondary | ICD-10-CM | POA: Diagnosis not present

## 2021-01-23 LAB — SARS CORONAVIRUS 2 (TAT 6-24 HRS): SARS Coronavirus 2: NEGATIVE

## 2021-01-25 ENCOUNTER — Ambulatory Visit: Payer: Medicare HMO | Attending: Neurology

## 2021-01-25 DIAGNOSIS — G4733 Obstructive sleep apnea (adult) (pediatric): Secondary | ICD-10-CM | POA: Insufficient documentation

## 2021-01-25 DIAGNOSIS — J984 Other disorders of lung: Secondary | ICD-10-CM | POA: Insufficient documentation

## 2021-01-25 DIAGNOSIS — I509 Heart failure, unspecified: Secondary | ICD-10-CM | POA: Diagnosis not present

## 2021-01-29 ENCOUNTER — Other Ambulatory Visit: Payer: Self-pay

## 2021-07-16 ENCOUNTER — Other Ambulatory Visit: Payer: Self-pay | Admitting: Family Medicine

## 2021-07-16 ENCOUNTER — Ambulatory Visit
Admission: RE | Admit: 2021-07-16 | Discharge: 2021-07-16 | Disposition: A | Discharge status: Home / Self Care | Payer: Medicare HMO | Source: Ambulatory Visit | Attending: Family Medicine | Admitting: Family Medicine

## 2021-07-16 DIAGNOSIS — M79605 Pain in left leg: Secondary | ICD-10-CM | POA: Diagnosis present

## 2021-10-10 IMAGING — US US EXTREM LOW VENOUS*L*
2 series · 14 of 24 positions shown · non-contrast
Comparison: None.

CLINICAL DATA: Left leg pain, swelling

EXAM:
LEFT LOWER EXTREMITY VENOUS DOPPLER ULTRASOUND
TECHNIQUE: Gray-scale sonography with compression, as well as color and duplex
ultrasound, were performed to evaluate the deep venous system(s)
from the level of the common femoral vein through the popliteal and
proximal calf veins.

[Series 1: us extrem low venous*left* · 0.09mm/px · 13 of 34 slices shown (1 of 2)]
[im 1/34]
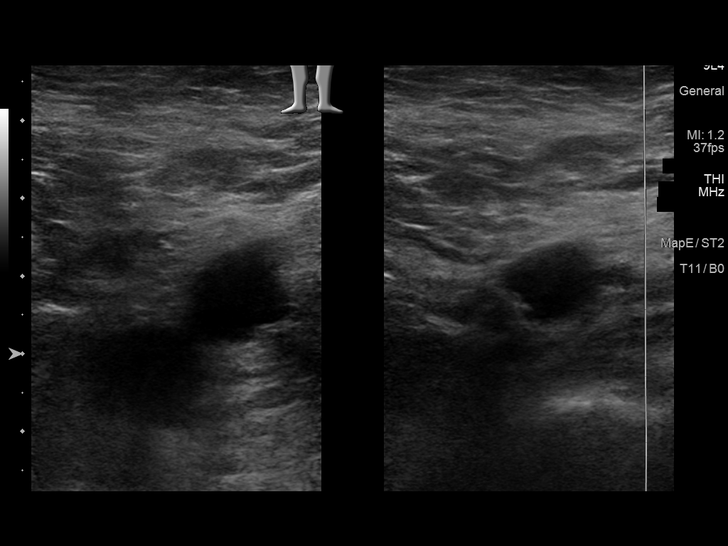
[im 4/34]
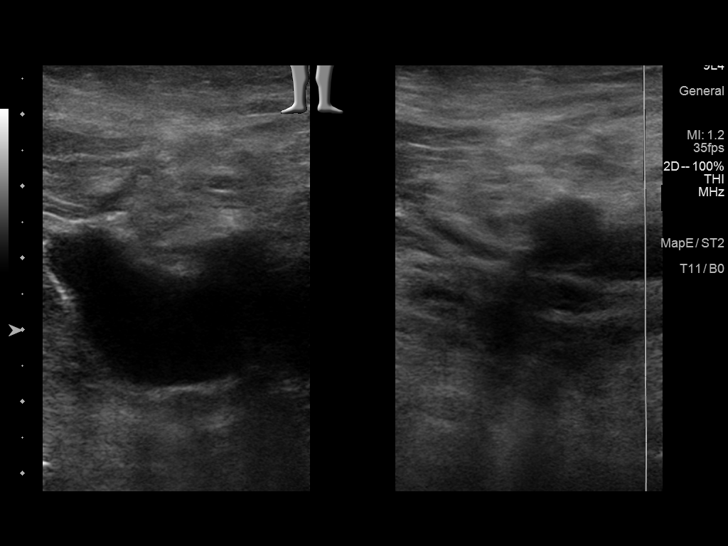
[im 7/34]
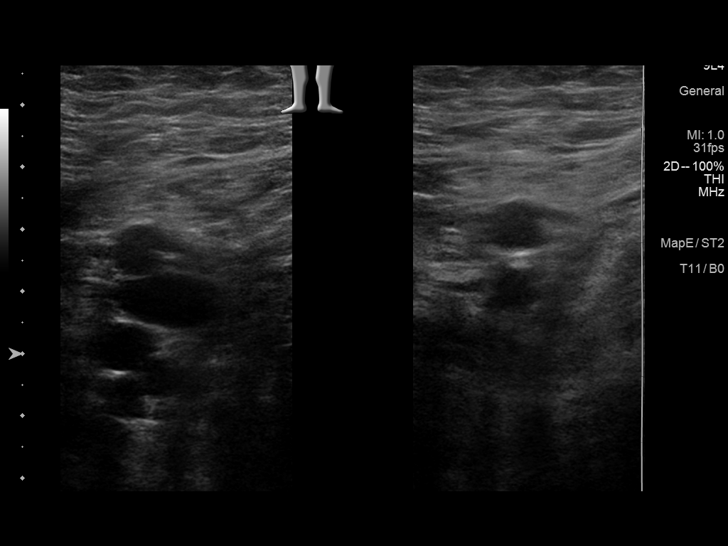
[im 10/34]
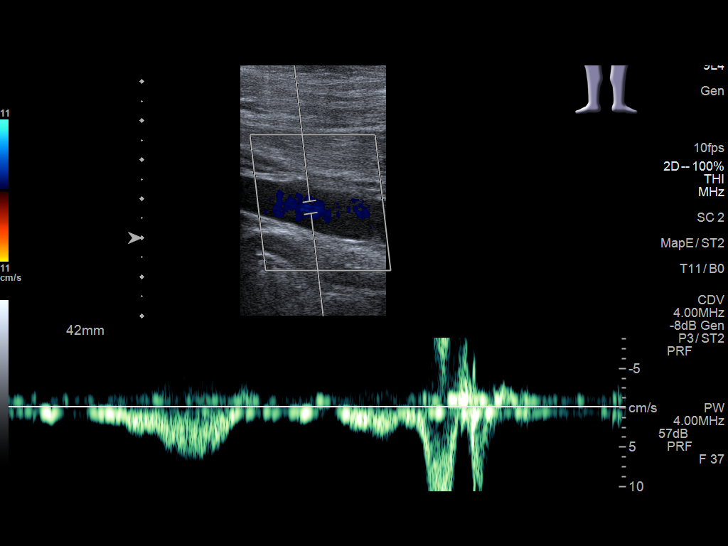
[im 11/34]
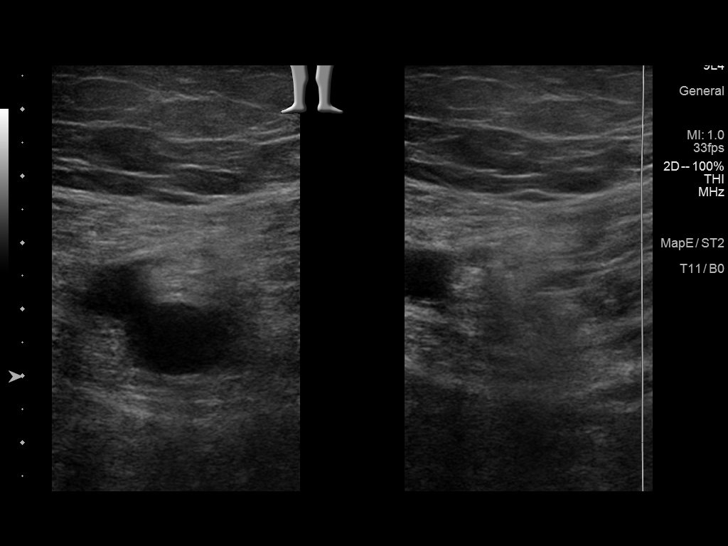
[im 14/34]
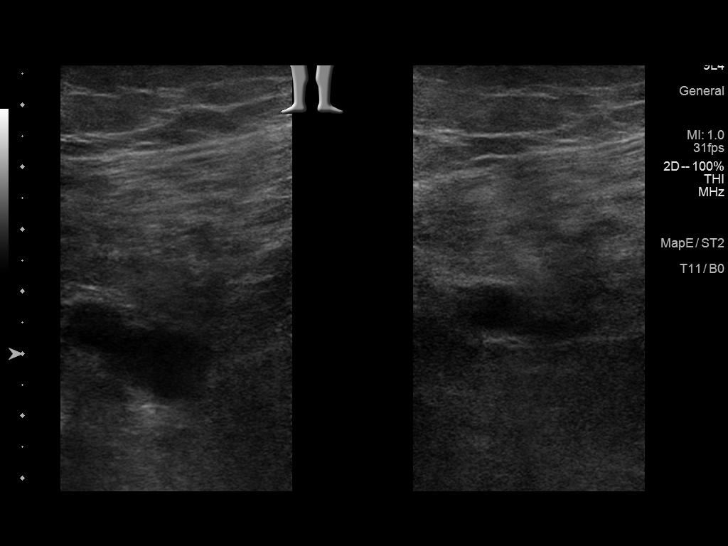
[im 17/34]
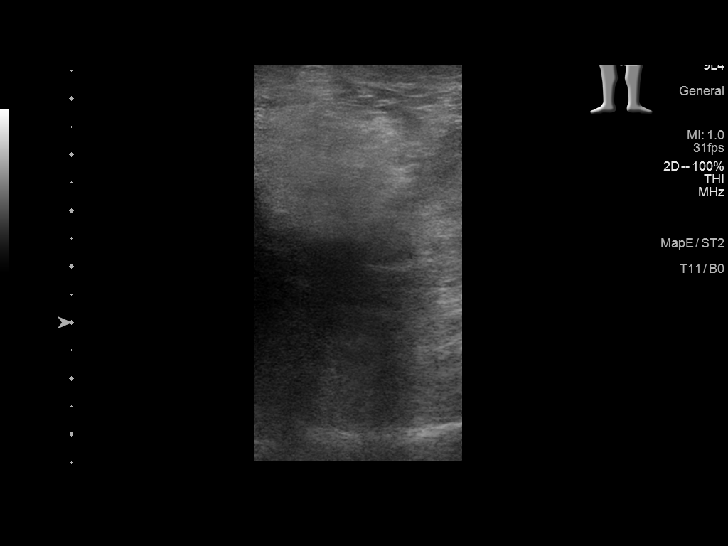
[im 19/34]
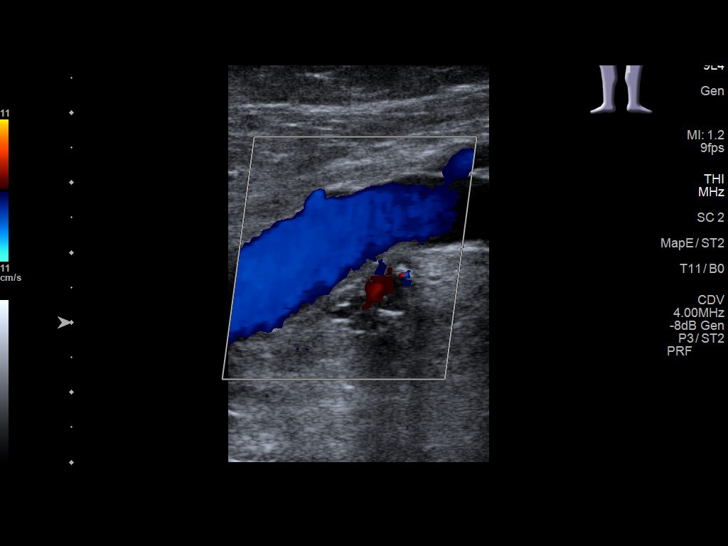
[im 22/34]
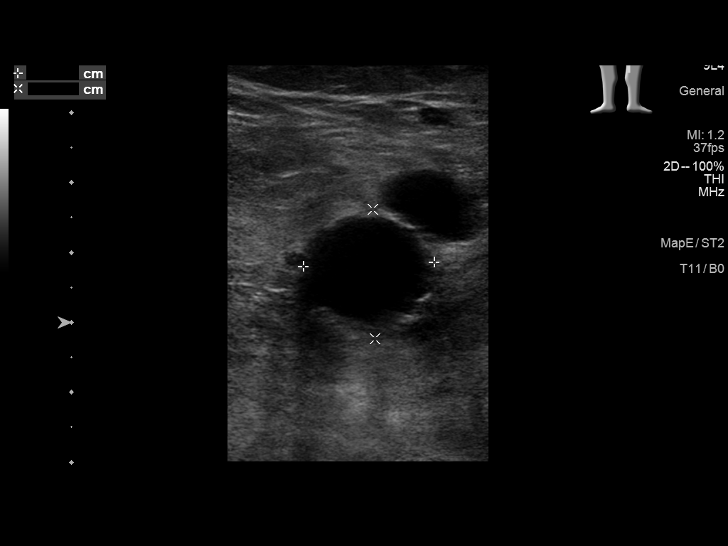
[im 25/34]
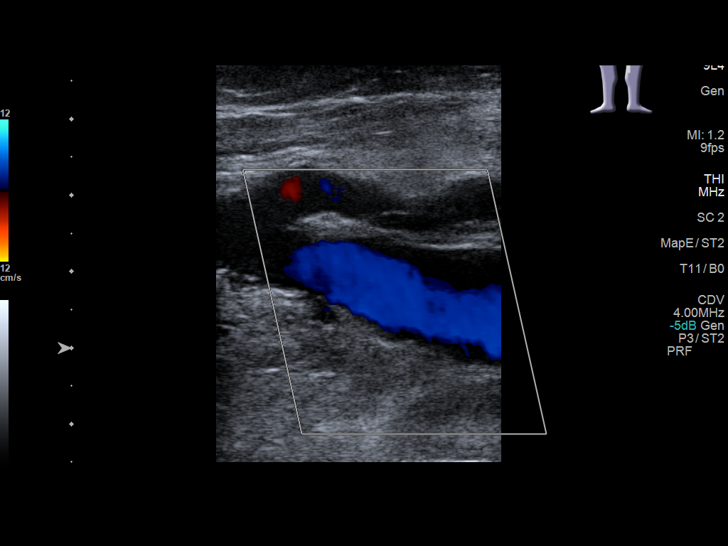
[im 28/34]
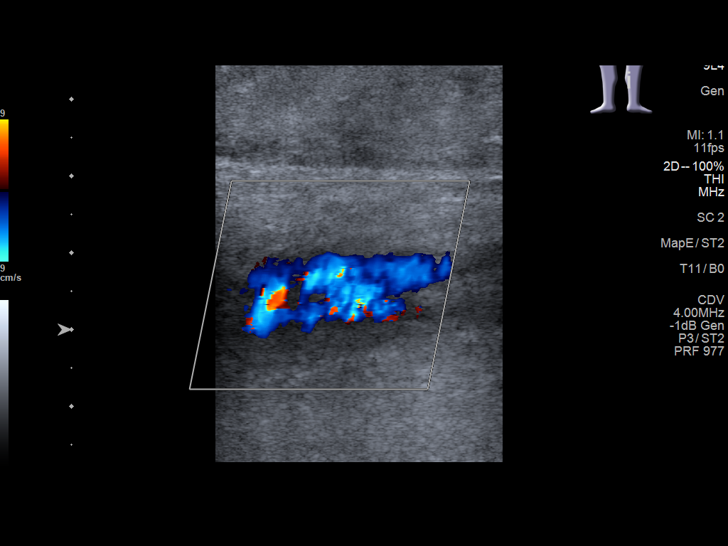
[im 29/34]
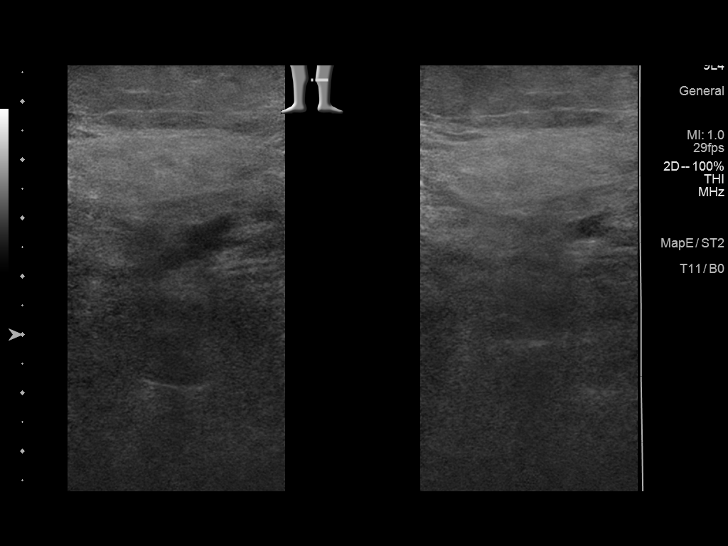
[im 32/34]
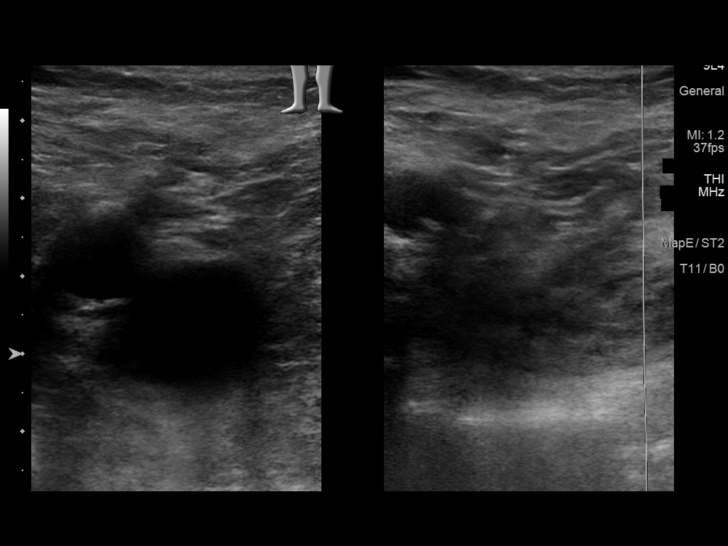

[Series 2: us extrem low venous*left* · 0.10mm/px · 1 of 1 slices shown (2 of 2)]
[im 1/1]
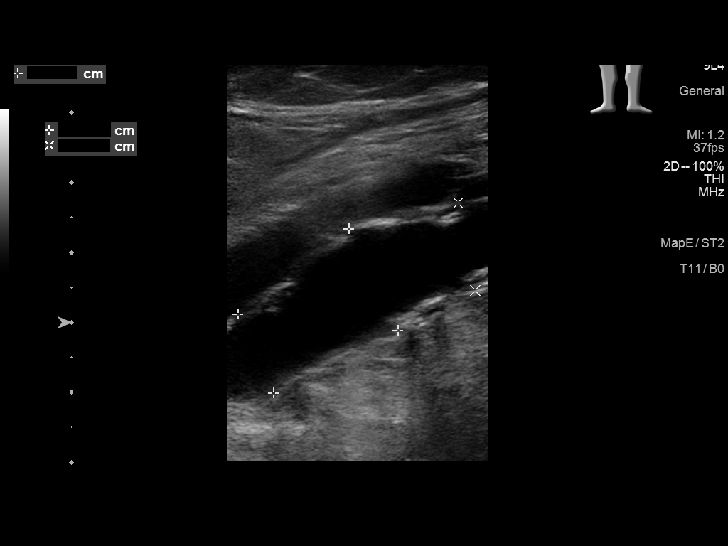

[14 of 24 positions shown; findings below may reference images not displayed]

FINDINGS: VENOUS

Normal compressibility of the common femoral, superficial femoral,
and popliteal veins, as well as the visualized calf veins.
Visualized portions of profunda femoral vein and great saphenous
vein unremarkable. No filling defects to suggest DVT on grayscale or
color Doppler imaging. Doppler waveforms show normal direction of
venous flow, normal respiratory phasicity and response to
augmentation.

Limited views of the contralateral common femoral vein are
unremarkable.

OTHER

1.9 cm popliteal artery aneurysm.

Limitations: none
IMPRESSION: No evidence of left lower extremity DVT.

Left popliteal artery aneurysm, 1.9 cm.

## 2021-10-14 IMAGING — US US EXTREM LOW ARTERIAL SEG MULTIPLE*L*
1 series · 14 of 25 positions shown · non-contrast
Comparison: None.

CLINICAL DATA: 79-year-old male with a history of vascular risk
factors

EXAM:
LEFT LOWER EXTREMITY ARTERIAL DUPLEX SCAN
TECHNIQUE: Gray-scale sonography as well as color Doppler and duplex ultrasound
was performed to evaluate the lower extremity arteries including the
common, superficial and profunda femoral arteries, popliteal artery
and calf arteries.

[Series 1: us extrem low arterial seg multiple*left* · 0.09mm/px · 14 of 52 slices shown]
[im 1/52]
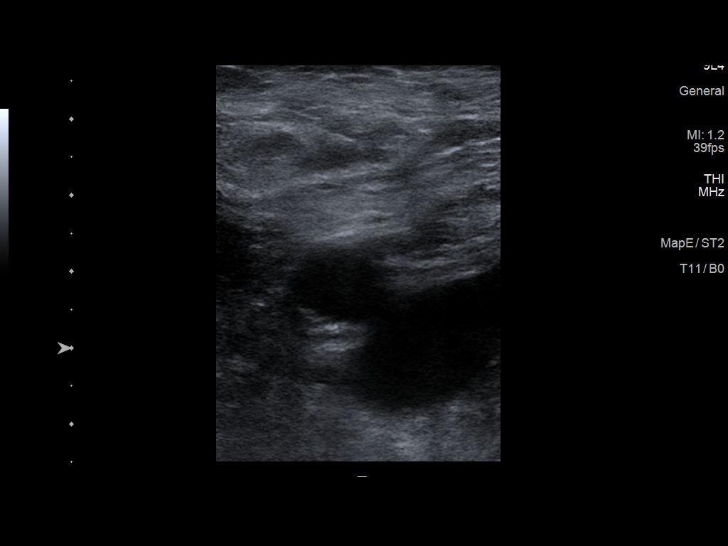
[im 5/52]
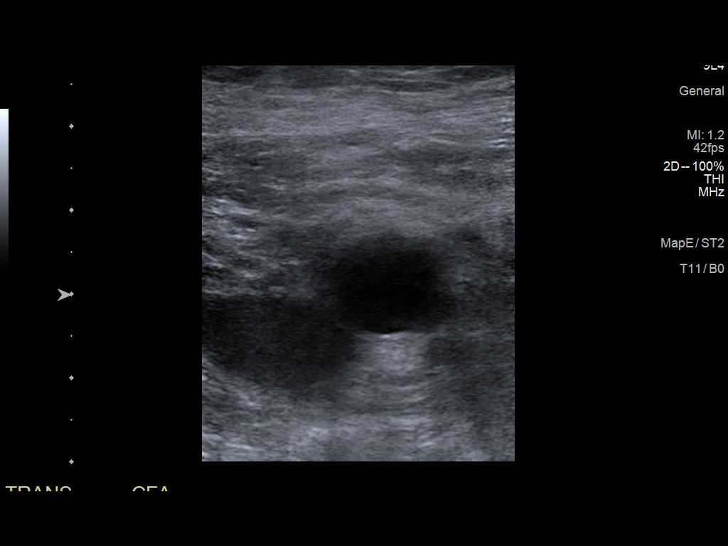
[im 9/52]
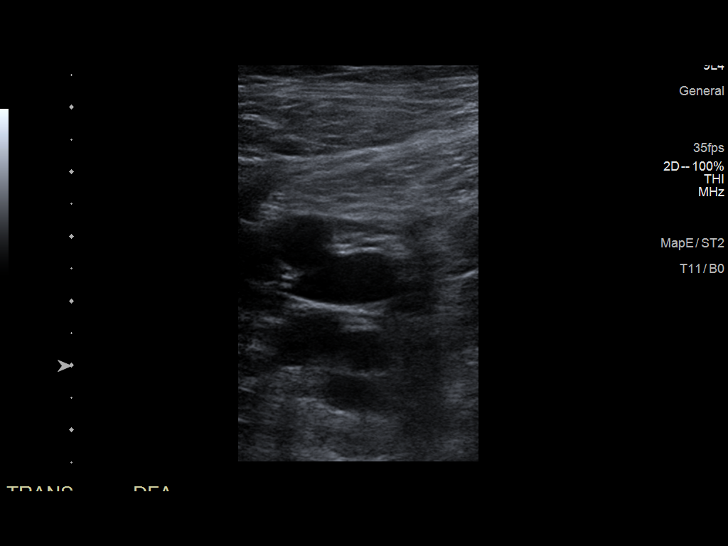
[im 13/52]
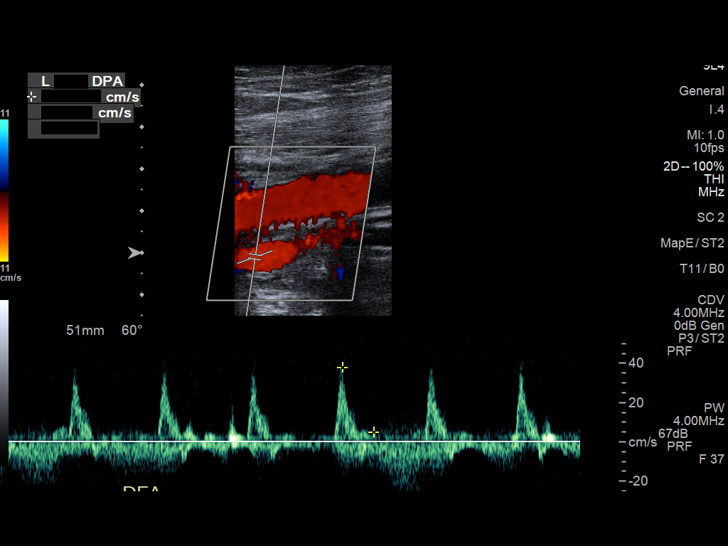
[im 18/52]
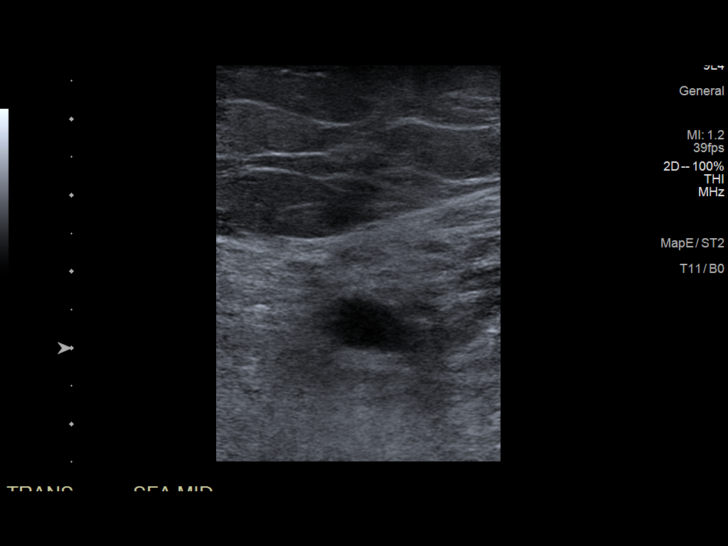
[im 20/52]
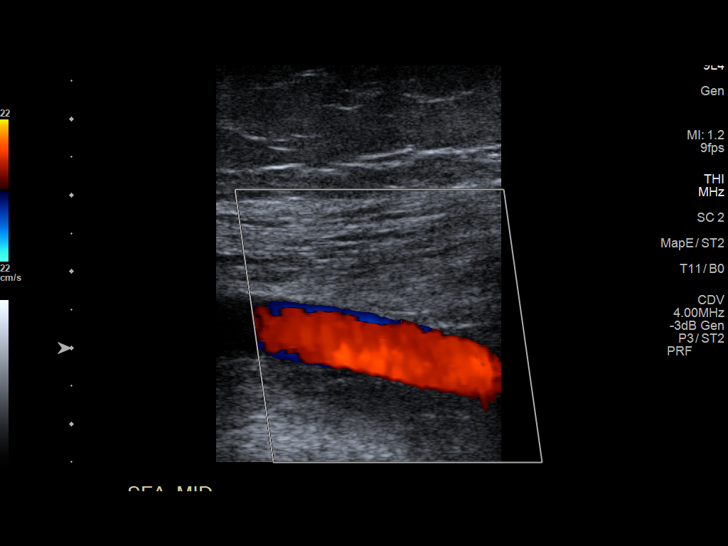
[im 24/52]
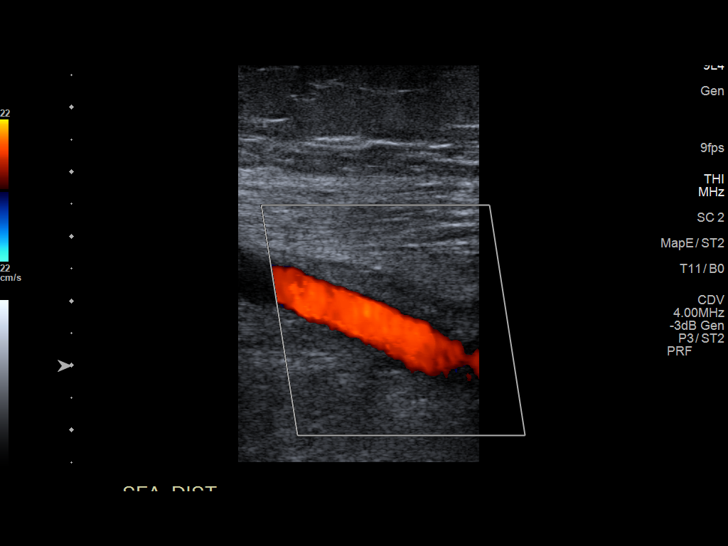
[im 28/52]
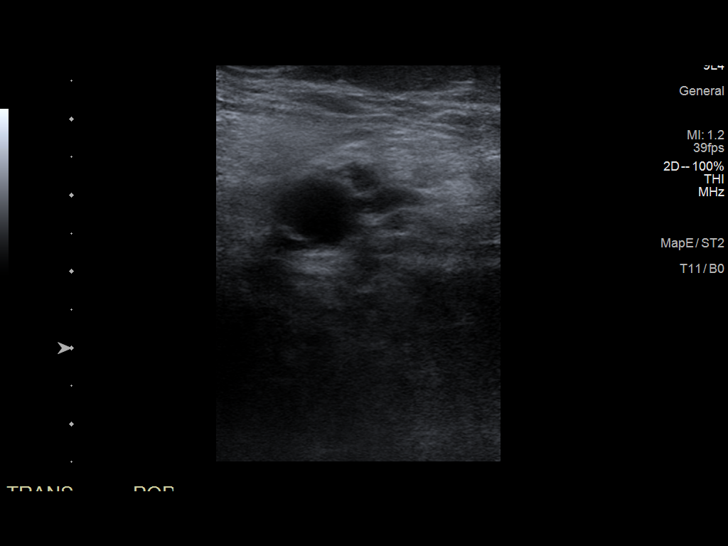
[im 32/52]
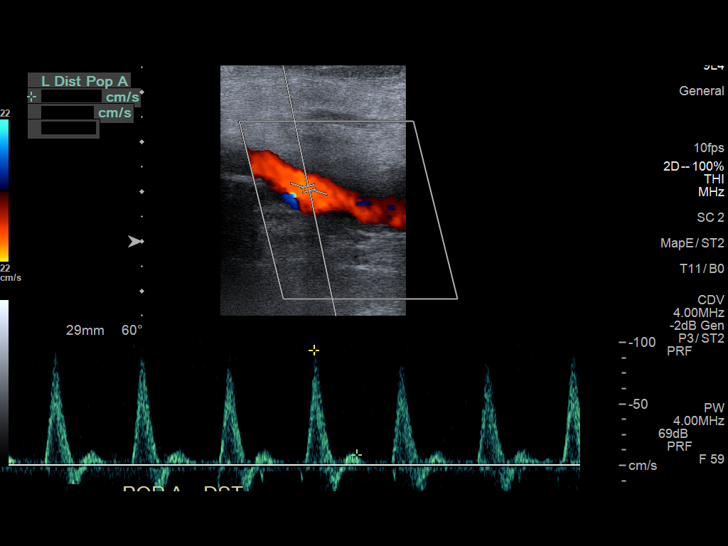
[im 35/52]
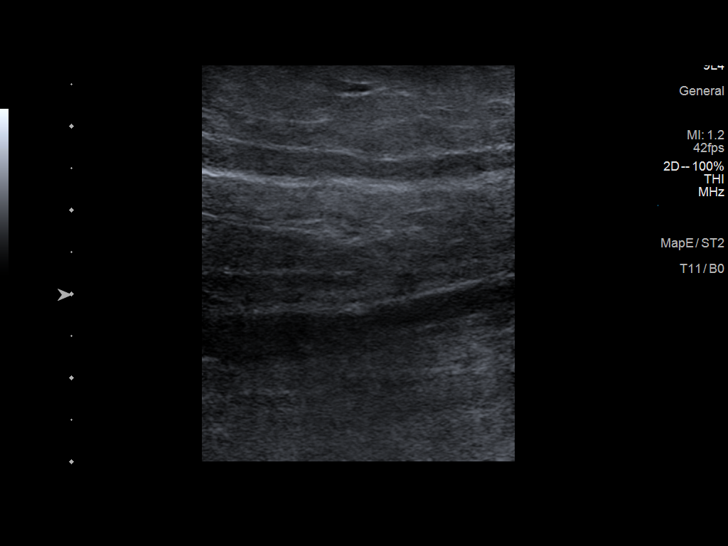
[im 39/52]
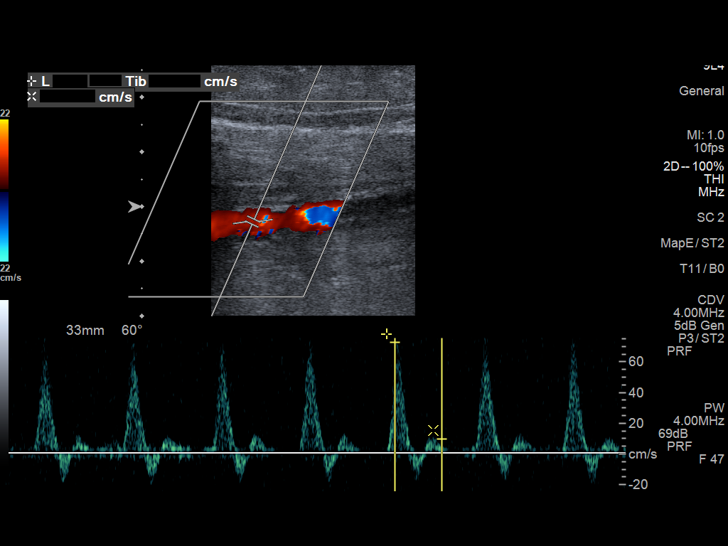
[im 43/52]
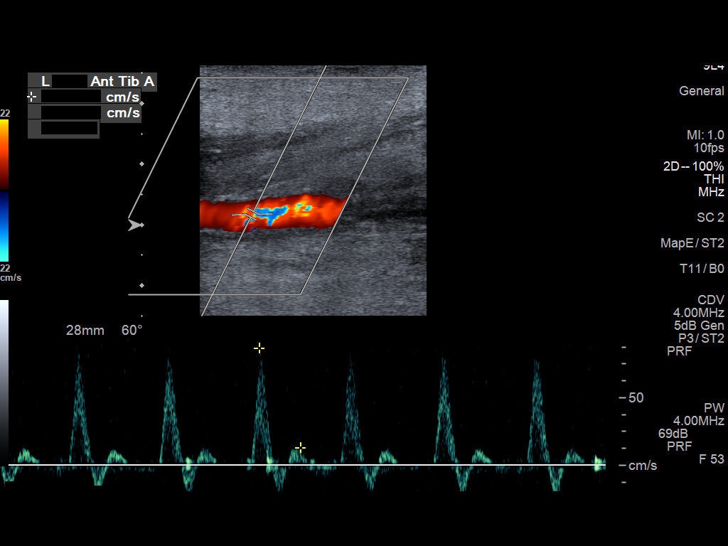
[im 47/52]
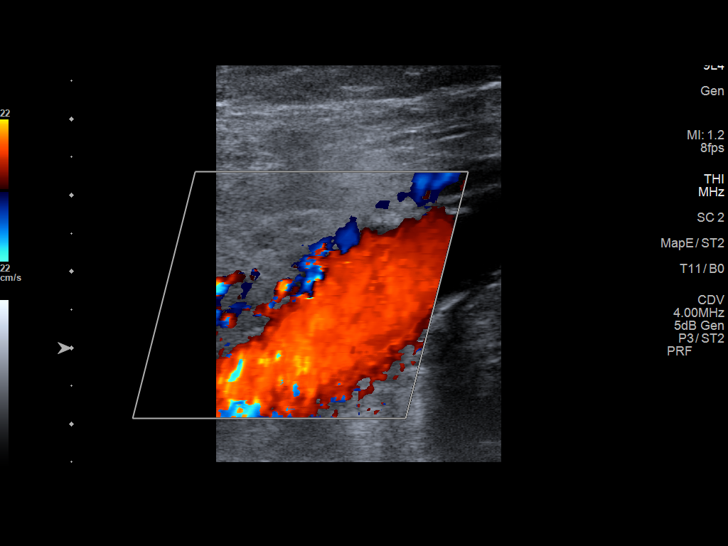
[im 52/52]
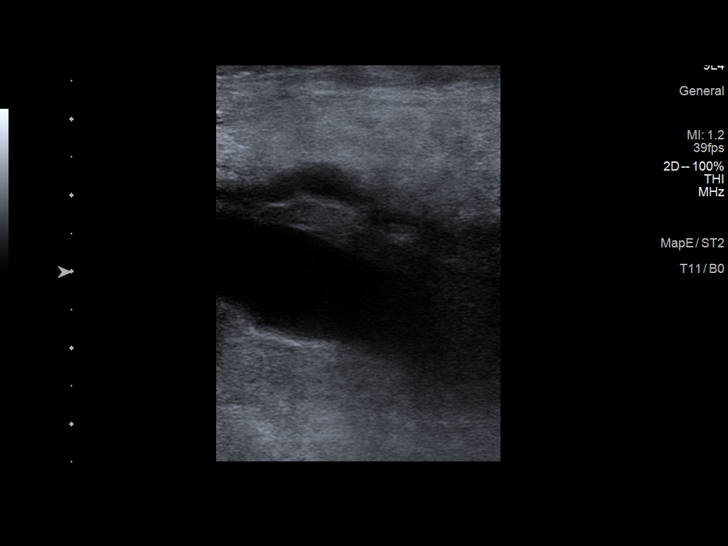

[14 of 25 positions shown; findings below may reference images not displayed]

FINDINGS: Left lower Extremity

ABI: Not calculated

Directed duplex demonstrates triphasic waveforms throughout the left
lower extremity.
IMPRESSION: Triphasic waveforms throughout the left lower extremity.

## 2022-01-31 ENCOUNTER — Ambulatory Visit
Admission: RE | Admit: 2022-01-31 | Discharge: 2022-01-31 | Disposition: A | Discharge status: Home / Self Care | Payer: Medicare HMO | Source: Ambulatory Visit | Attending: Internal Medicine | Admitting: Internal Medicine

## 2022-01-31 ENCOUNTER — Other Ambulatory Visit: Payer: Self-pay | Admitting: Internal Medicine

## 2022-01-31 DIAGNOSIS — R1031 Right lower quadrant pain: Secondary | ICD-10-CM
